# Patient Record
Sex: Female | Born: 1985 | Race: White | Hispanic: No | Marital: Single | Smoking: Former smoker
Health system: Southern US, Community
[De-identification: ages and names within clinical notes are randomized; demographics above are authoritative.]

## PROBLEM LIST (undated history)

## (undated) DIAGNOSIS — F419 Anxiety disorder, unspecified: Secondary | ICD-10-CM

## (undated) DIAGNOSIS — Q211 Atrial septal defect, unspecified: Secondary | ICD-10-CM

## (undated) DIAGNOSIS — I341 Nonrheumatic mitral (valve) prolapse: Secondary | ICD-10-CM

## (undated) DIAGNOSIS — I10 Essential (primary) hypertension: Secondary | ICD-10-CM

## (undated) HISTORY — DX: Nonrheumatic mitral (valve) prolapse: I34.1

## (undated) HISTORY — DX: Anxiety disorder, unspecified: F41.9

## (undated) HISTORY — DX: Essential (primary) hypertension: I10

## (undated) HISTORY — DX: Atrial septal defect, unspecified: Q21.10

## (undated) HISTORY — PX: CARDIAC SURGERY: SHX584

## (undated) HISTORY — DX: Atrial septal defect: Q21.1

---
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
## 2004-10-20 ENCOUNTER — Ambulatory Visit: Payer: Self-pay | Admitting: *Deleted

## 2007-02-04 ENCOUNTER — Ambulatory Visit: Payer: Self-pay | Admitting: Internal Medicine

## 2007-03-10 ENCOUNTER — Ambulatory Visit: Payer: Self-pay | Admitting: Internal Medicine

## 2007-05-20 ENCOUNTER — Ambulatory Visit: Payer: Self-pay | Admitting: Internal Medicine

## 2007-06-27 ENCOUNTER — Ambulatory Visit: Payer: Self-pay | Admitting: Internal Medicine

## 2007-10-15 ENCOUNTER — Emergency Department (HOSPITAL_COMMUNITY): Admission: EM | Admit: 2007-10-15 | Discharge: 2007-10-15 | Payer: Self-pay | Admitting: Emergency Medicine

## 2009-01-28 ENCOUNTER — Emergency Department (HOSPITAL_COMMUNITY): Admission: EM | Admit: 2009-01-28 | Discharge: 2009-01-28 | Payer: Self-pay | Admitting: Emergency Medicine

## 2009-02-18 ENCOUNTER — Ambulatory Visit (HOSPITAL_COMMUNITY): Admission: RE | Admit: 2009-02-18 | Discharge: 2009-02-18 | Payer: Self-pay | Admitting: Preventative Medicine

## 2009-06-25 ENCOUNTER — Telehealth (INDEPENDENT_AMBULATORY_CARE_PROVIDER_SITE_OTHER): Payer: Self-pay | Admitting: *Deleted

## 2009-07-03 ENCOUNTER — Ambulatory Visit: Payer: Self-pay | Admitting: Cardiology

## 2009-07-03 DIAGNOSIS — I059 Rheumatic mitral valve disease, unspecified: Secondary | ICD-10-CM | POA: Insufficient documentation

## 2009-07-03 DIAGNOSIS — Q211 Atrial septal defect: Secondary | ICD-10-CM | POA: Insufficient documentation

## 2009-07-03 DIAGNOSIS — I10 Essential (primary) hypertension: Secondary | ICD-10-CM | POA: Insufficient documentation

## 2009-09-14 HISTORY — PX: CHOLECYSTECTOMY: SHX55

## 2009-10-03 ENCOUNTER — Encounter: Payer: Self-pay | Admitting: Cardiology

## 2009-10-03 ENCOUNTER — Ambulatory Visit: Payer: Self-pay | Admitting: Cardiology

## 2009-10-03 ENCOUNTER — Ambulatory Visit (HOSPITAL_COMMUNITY): Admission: RE | Admit: 2009-10-03 | Discharge: 2009-10-03 | Payer: Self-pay | Admitting: Cardiology

## 2009-10-06 ENCOUNTER — Inpatient Hospital Stay (HOSPITAL_COMMUNITY): Admission: EM | Admit: 2009-10-06 | Discharge: 2009-10-09 | Payer: Self-pay | Admitting: Emergency Medicine

## 2009-10-07 ENCOUNTER — Ambulatory Visit: Payer: Self-pay | Admitting: Gastroenterology

## 2009-10-07 ENCOUNTER — Encounter: Payer: Self-pay | Admitting: Cardiology

## 2009-10-08 ENCOUNTER — Encounter (INDEPENDENT_AMBULATORY_CARE_PROVIDER_SITE_OTHER): Payer: Self-pay | Admitting: Internal Medicine

## 2009-10-17 ENCOUNTER — Encounter: Payer: Self-pay | Admitting: Gastroenterology

## 2009-11-06 ENCOUNTER — Ambulatory Visit: Payer: Self-pay | Admitting: Gastroenterology

## 2009-11-06 DIAGNOSIS — K5900 Constipation, unspecified: Secondary | ICD-10-CM | POA: Insufficient documentation

## 2009-11-06 DIAGNOSIS — K8020 Calculus of gallbladder without cholecystitis without obstruction: Secondary | ICD-10-CM | POA: Insufficient documentation

## 2010-07-10 ENCOUNTER — Ambulatory Visit: Payer: Self-pay | Admitting: Cardiology

## 2010-09-02 ENCOUNTER — Emergency Department (HOSPITAL_COMMUNITY)
Admission: EM | Admit: 2010-09-02 | Discharge: 2010-09-02 | Payer: Self-pay | Source: Home / Self Care | Admitting: Emergency Medicine

## 2010-09-09 ENCOUNTER — Encounter: Payer: Self-pay | Admitting: Orthopedic Surgery

## 2010-09-09 ENCOUNTER — Ambulatory Visit
Admission: RE | Admit: 2010-09-09 | Discharge: 2010-09-09 | Payer: Self-pay | Source: Home / Self Care | Attending: Cardiology | Admitting: Cardiology

## 2010-09-09 DIAGNOSIS — R079 Chest pain, unspecified: Secondary | ICD-10-CM | POA: Insufficient documentation

## 2010-09-10 ENCOUNTER — Encounter: Payer: Self-pay | Admitting: Cardiology

## 2010-09-16 ENCOUNTER — Ambulatory Visit
Admission: RE | Admit: 2010-09-16 | Discharge: 2010-09-16 | Payer: Self-pay | Source: Home / Self Care | Attending: Orthopedic Surgery | Admitting: Orthopedic Surgery

## 2010-09-16 ENCOUNTER — Encounter: Payer: Self-pay | Admitting: Orthopedic Surgery

## 2010-09-16 ENCOUNTER — Other Ambulatory Visit: Payer: Self-pay | Admitting: Cardiology

## 2010-09-16 ENCOUNTER — Ambulatory Visit (HOSPITAL_COMMUNITY)
Admission: RE | Admit: 2010-09-16 | Discharge: 2010-09-16 | Payer: Self-pay | Source: Home / Self Care | Attending: Cardiology | Admitting: Cardiology

## 2010-09-16 DIAGNOSIS — S62309A Unspecified fracture of unspecified metacarpal bone, initial encounter for closed fracture: Secondary | ICD-10-CM | POA: Insufficient documentation

## 2010-10-15 ENCOUNTER — Ambulatory Visit: Admit: 2010-10-15 | Payer: Self-pay | Admitting: Orthopedic Surgery

## 2010-10-15 ENCOUNTER — Ambulatory Visit: Admit: 2010-10-15 | Payer: Self-pay | Admitting: Cardiology

## 2010-10-15 ENCOUNTER — Ambulatory Visit (INDEPENDENT_AMBULATORY_CARE_PROVIDER_SITE_OTHER): Payer: Self-pay | Admitting: Orthopedic Surgery

## 2010-10-15 ENCOUNTER — Encounter: Payer: Self-pay | Admitting: Cardiology

## 2010-10-15 ENCOUNTER — Encounter: Payer: Self-pay | Admitting: Orthopedic Surgery

## 2010-10-15 ENCOUNTER — Ambulatory Visit (INDEPENDENT_AMBULATORY_CARE_PROVIDER_SITE_OTHER): Payer: Self-pay | Admitting: Cardiology

## 2010-10-15 DIAGNOSIS — F17200 Nicotine dependence, unspecified, uncomplicated: Secondary | ICD-10-CM | POA: Insufficient documentation

## 2010-10-15 DIAGNOSIS — I10 Essential (primary) hypertension: Secondary | ICD-10-CM

## 2010-10-15 DIAGNOSIS — S62309A Unspecified fracture of unspecified metacarpal bone, initial encounter for closed fracture: Secondary | ICD-10-CM

## 2010-10-15 DIAGNOSIS — Q211 Atrial septal defect: Secondary | ICD-10-CM

## 2010-10-15 DIAGNOSIS — R079 Chest pain, unspecified: Secondary | ICD-10-CM

## 2010-10-16 NOTE — Letter (Signed)
Summary:  Results Engineer, agricultural at [facility]  [address] [place] [address]   Phone: [phone]  Fax: [phone]      October 07, 2009 MRN: [id number]   [name] [name] [address] [place], [place]  [address]   Dear [name],  Your test ordered by Selena Batten has been reviewed by your physician (or physician assistant) and was found to be normal or stable. Your physician (or physician assistant) felt no changes were needed at this time.  __X__ Echocardiogram  ____ Cardiac Stress Test  ____ Lab Work  ____ Peripheral vascular study of arms, legs or neck  ____ CT scan or X-ray  ____ Lung or Breathing test  ____ Other: Please continue on current medical treatment.  Thank you.   Nona Dell, MD, F.A.C.C
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
## 2010-10-16 NOTE — Consult Note (Signed)
Summary: Consultation Report  Consultation Report   Imported By: Diana Eves 10/17/2009 10:50:13  _____________________________________________________________________  External Attachment:    Type:   Image     Comment:   External Document

## 2010-10-16 NOTE — Assessment & Plan Note (Signed)
Summary: 1 yr f/u per checkut on 07/03/09/tg      Allergies Added:   Visit Type:  Follow-up Primary Provider:  [facility] Department   History of Present Illness: 25 year old woman presents for annual followup. She denies any significant palpitations, and has had no exertional chest pain or shortness of breath. She is no longer on antihypertensive therapy.  Record review finds that she underwent a laparoscopic cholecystectomy back in January of this year. At that time she was taking Maxide, although this was stopped at discharge. She states that she has gained weight since earlier in the year. She is not exercising regularly.  She continues to smoke cigarettes. We have discussed smoking cessation.   We also talked about sodium restriction and weight loss, as her blood pressure is elevated today compared to the prior visit. She has not had recent followup for this with the health department.  I review the results of her echocardiogram done back in January. ECG is reviewed from January as well showing sinus bradycardia at 52 beats per minute with decreased anterior R wave progression. No acute changes noted. She did not want a repeat ECG today.    Current Medications (verified): 1)  Ortho Micronor 0.35 Mg Tabs (Norethindrone (Contraceptive)) .... Take 1 Tab Daily  Allergies (verified): 1)  ! Pcn 2)  ! Amoxicillin  Past History:  Past Medical History: Last updated: 07/03/2009 Congenital Heart Disease - ASD Hypertension Mitral Valve Prolapse  Past Surgical History: Last updated: 11/06/2009 ASD repair [facility] 2002. Cholecystectomy-stone, Dr. Lovell Sheehan 2011  Social History: Last updated: 03/13/2009 Single  Tobacco Use - Yes.  1 pack daily Regular Exercise - no Drug Use - no  Review of Systems       The patient complains of weight gain.  The patient denies anorexia, fever, chest pain, syncope, dyspnea on exertion, peripheral edema, prolonged cough,  abdominal pain, melena, and hematochezia.         Otherwise reviewed and negative.  Vital Signs:  Patient profile:   25 year old female Pulse rate:   76 / minute BP sitting:   138 / 94  (left arm)  Physical Exam  Additional Exam:  Overweight woman in no acute distress. HEENT: Conjunctivae and lids normal, oropharynx clear. Neck: Supple, no bruits or thyromegaly. Lungs: Clear to auscultation, nonlabored breathing at rest. Cardiac: Regular rate and rhythm, no loud mid-systolic click. Very soft systolic murmur at the base. No diastolic murmur. No S3 gallop. Extremities: No significant pitting edema. Skin: Warm and dry. Musculoskeletal: No kyphosis noted. Neuropsychiatric: Alert and oriented x3, affect appropriate.   Impression & Recommendations:  Problem # 1:  MITRAL VALVE PROLAPSE (ICD-424.0)  Not evident by followup echocardiogram in January 2011. Patient did have mild acentric mitral regurgitation which can be followed clinically at this point. LVEF 55-60%.  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  Problem # 3:  ESSENTIAL HYPERTENSION, BENIGN (ICD-401.1)  Blood pressure elevated today compared to last visit. She is no longer on Maxide. She has also gained weight. We discussed weight loss through combination of diet and exercise, also sodium restriction. She needs more regular followup of her blood pressure, particularly as she is on oral contraceptives. I recommended that she keep a log of her blood pressure measurements and follow up at the Health Department more regularly. She may need to resume medical therapy over time.  Patient Instructions: 1)  Your physician recommends that you schedule a follow-up appointment in: 1 year 2)  Your physician recommends that you continue on your current medications as directed. Please refer to the Current Medication list given to you  today.

## 2010-10-16 NOTE — Letter (Signed)
Summary: history form   history form   Imported By: Eugenio Hoes 09/17/2010 16:13:31  _____________________________________________________________________  External Attachment:    Type:   Image     Comment:   External Document
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
## 2010-10-16 NOTE — Assessment & Plan Note (Signed)
Summary: CONSTIPATION/GALLSTONES/CHOLECYSTECTOMY   Visit Type:  Follow-up Visit Primary Care Provider:  Health Dept  Chief Complaint:  hosp follow up.  History of Present Illness: Having rabbit terd stool. No pain. Appetite is good. taking stool softener. Has pain at incision sites as it rubs. No nausea, vomiting, heartburn and indigestion. Was having problems with constipation. BM:3-4x/week and now more frequent but still hard. Water: usu quite a bit. Fiber content?  Current Medications (verified): 1)  Ortho Micronor 0.35 Mg Tabs (Norethindrone (Contraceptive)) .... Take 1 Tab Daily  Allergies (verified): 1)  ! Pcn 2)  ! Amoxicillin  Past History:  Past Medical History: Last updated: 07/03/2009 Congenital Heart Disease - ASD Hypertension Mitral Valve Prolapse  Past Surgical History: ASD repair [facility] 2002. Cholecystectomy-stone, Dr. Lovell Sheehan 2011  Vital Signs:  Patient profile:   25 year old female Height:      64 inches Weight:      155 pounds BMI:     26.70 Temp:     97.7 degrees F oral Pulse rate:   76 / minute BP sitting:   120 / 88  (left arm) Cuff size:   regular  Vitals Entered By: Hendricks Limes LPN (November 06, 2009 10:38 AM)  Physical Exam  General:  Well developed, well nourished, no acute distress. Head:  Normocephalic and atraumatic. Lungs:  Clear throughout to auscultation. Heart:  Regular rate and rhythm; no murmurs Abdomen:  Soft, nontender and nondistended. Normal bowel sounds.  Impression & Recommendations:  Problem # 1:  CONSTIPATION (ICD-564.00) Assessment Unchanged  FOR HARD STOOLS-Drink 6-8 cups of water daily. Follow a high fiber diet. SEE HANDOUT. AVOID ITEMS THAT CAUSE BLOATING. May use stool softener 1-2 times a day. ADD BENEFIBER 2 tsp once daily and increase to two times a day after 7 days. Follow up with Dr. Darrick Penna as needed.  CC: PCP  Orders: Est. Patient Level III ([address])  Problem # 2:  CHOLELITHIASIS  (ICD-574.20) Assessment: Comment Only  s/p cholecystectomy  Orders: Est. Patient Level III ([address])  Patient Instructions: 1)  FOR HARD STOOLS 2)  Drink 6-8 cups of water daily. 3)  Follow a high fiber diet. SEE HANDOUT. 4)  **AVOID ITEMS THAT CAUSE BLOATING** 5)  May use stool softener 1-2 times a day. 6)  ADD BENEFIBER 2 tsp once daily and increase to two times a day after 7 days. 7)  Follow up with Dr. Darrick Penna as needed. 8)  The medication list was reviewed and reconciled.  All changed / newly prescribed medications were explained.  A complete medication list was provided to the patient / caregiver.
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
## 2010-10-22 NOTE — Letter (Signed)
Summary: Work Writer at [employer]  [address] [place] [address]   Phone: [phone]  Fax: [phone]     October 15, 2010    [name] [name]   The above named patient had a medical visit today at: 3:20 pm.  Please take this into consideration when reviewing the time away from work/school.      Sincerely yours,    Architectural technologist

## 2010-10-22 NOTE — Letter (Signed)
Summary: Out of Work  [facility]  [address]  [place], [place] [address]   Phone: [phone]  Fax: [phone]    October 15, 2010   Employee:  [name] [name]    To Whom It May Concern:   For Medical reasons, please excuse the above named employee from work for the following dates:  Start:   October 15, 2010 - appointment in our office today  End/Return to work:      October 16, 2010     If you need additional information, please feel free to contact our office.         Sincerely,    Terrance Mass, MD
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
## 2010-10-28 ENCOUNTER — Encounter: Payer: Self-pay | Admitting: Orthopedic Surgery

## 2010-11-05 NOTE — Letter (Signed)
Summary: Medical records request [facility]  Medical records request [facility]   Imported By: Cammie Sickle 10/28/2010 19:11:12  _____________________________________________________________________  External Attachment:    Type:   Image     Comment:   External Document

## 2010-11-24 LAB — POCT PREGNANCY, URINE: Preg Test, Ur: NEGATIVE

## 2010-11-30 LAB — DIFFERENTIAL
Basophils Absolute: 0 10*3/uL (ref 0.0–0.1)
Basophils Absolute: 0 10*3/uL (ref 0.0–0.1)
Basophils Absolute: 0.1 10*3/uL (ref 0.0–0.1)
Eosinophils Relative: 5 % (ref 0–5)
Lymphocytes Relative: 24 % (ref 12–46)
Lymphocytes Relative: 26 % (ref 12–46)
Lymphocytes Relative: 35 % (ref 12–46)
Lymphs Abs: 1.9 10*3/uL (ref 0.7–4.0)
Lymphs Abs: 2.1 10*3/uL (ref 0.7–4.0)
Monocytes Absolute: 0.6 10*3/uL (ref 0.1–1.0)
Monocytes Absolute: 0.6 10*3/uL (ref 0.1–1.0)
Monocytes Absolute: 0.7 10*3/uL (ref 0.1–1.0)
Monocytes Relative: 8 % (ref 3–12)
Neutro Abs: 2.8 10*3/uL (ref 1.7–7.7)
Neutro Abs: 4.7 10*3/uL (ref 1.7–7.7)
Neutro Abs: 5.2 10*3/uL (ref 1.7–7.7)
Neutrophils Relative %: 64 % (ref 43–77)

## 2010-11-30 LAB — URINE MICROSCOPIC-ADD ON

## 2010-11-30 LAB — HEPATIC FUNCTION PANEL
ALT: 203 U/L — ABNORMAL HIGH (ref 0–35)
ALT: 377 U/L — ABNORMAL HIGH (ref 0–35)
AST: 117 U/L — ABNORMAL HIGH (ref 0–37)
AST: 290 U/L — ABNORMAL HIGH (ref 0–37)
Albumin: 3 g/dL — ABNORMAL LOW (ref 3.5–5.2)
Alkaline Phosphatase: 135 U/L — ABNORMAL HIGH (ref 39–117)
Bilirubin, Direct: 2.6 mg/dL — ABNORMAL HIGH (ref 0.0–0.3)
Total Protein: 5.6 g/dL — ABNORMAL LOW (ref 6.0–8.3)

## 2010-11-30 LAB — COMPREHENSIVE METABOLIC PANEL
ALT: 305 U/L — ABNORMAL HIGH (ref 0–35)
AST: 122 U/L — ABNORMAL HIGH (ref 0–37)
AST: 190 U/L — ABNORMAL HIGH (ref 0–37)
Albumin: 3.1 g/dL — ABNORMAL LOW (ref 3.5–5.2)
Albumin: 3.5 g/dL (ref 3.5–5.2)
Alkaline Phosphatase: 111 U/L (ref 39–117)
BUN: 4 mg/dL — ABNORMAL LOW (ref 6–23)
CO2: 26 mEq/L (ref 19–32)
Calcium: 8.7 mg/dL (ref 8.4–10.5)
Chloride: 105 mEq/L (ref 96–112)
Creatinine, Ser: 0.77 mg/dL (ref 0.4–1.2)
Creatinine, Ser: 0.79 mg/dL (ref 0.4–1.2)
GFR calc Af Amer: >60 mL/min (ref >60)
GFR calc Af Amer: >60 mL/min (ref >60)
GFR calc non Af Amer: >60 mL/min (ref >60)
Potassium: 4 mEq/L (ref 3.5–5.1)
Total Bilirubin: 2.4 mg/dL — ABNORMAL HIGH (ref 0.3–1.2)
Total Bilirubin: 2.9 mg/dL — ABNORMAL HIGH (ref 0.3–1.2)
Total Protein: 5.7 g/dL — ABNORMAL LOW (ref 6.0–8.3)

## 2010-11-30 LAB — BASIC METABOLIC PANEL
BUN: 6 mg/dL (ref 6–23)
CO2: 27 mEq/L (ref 19–32)
Calcium: 9 mg/dL (ref 8.4–10.5)
Chloride: 102 mEq/L (ref 96–112)
Creatinine, Ser: 0.85 mg/dL (ref 0.4–1.2)
GFR calc Af Amer: >60 mL/min (ref >60)
GFR calc non Af Amer: >60 mL/min (ref >60)
Potassium: 2.7 mEq/L — CL (ref 3.5–5.1)
Potassium: 3.4 mEq/L — ABNORMAL LOW (ref 3.5–5.1)
Sodium: 136 mEq/L (ref 135–145)

## 2010-11-30 LAB — URINALYSIS, ROUTINE W REFLEX MICROSCOPIC
Glucose, UA: NEGATIVE mg/dL
Ketones, ur: NEGATIVE mg/dL
Protein, ur: NEGATIVE mg/dL
pH: 6.5 (ref 5.0–8.0)

## 2010-11-30 LAB — CBC
HCT: 37.4 % (ref 36.0–46.0)
HCT: 39.2 % (ref 36.0–46.0)
Hemoglobin: 14.8 g/dL (ref 12.0–15.0)
MCHC: 33.8 g/dL (ref 30.0–36.0)
MCV: 88.8 fL (ref 78.0–100.0)
Platelets: 157 10*3/uL (ref 150–400)
Platelets: 169 10*3/uL (ref 150–400)
RBC: 4.26 MIL/uL (ref 3.87–5.11)
RBC: 4.93 MIL/uL (ref 3.87–5.11)
RDW: 12.6 % (ref 11.5–15.5)
RDW: 13 % (ref 11.5–15.5)
WBC: 5.5 10*3/uL (ref 4.0–10.5)
WBC: 7.8 10*3/uL (ref 4.0–10.5)
WBC: 8.1 10*3/uL (ref 4.0–10.5)

## 2010-11-30 LAB — PROTIME-INR: INR: 1.04 (ref 0.00–1.49)

## 2010-11-30 LAB — TSH: TSH: 2.968 u[IU]/mL (ref 0.350–4.500)

## 2010-11-30 LAB — HEPATITIS PANEL, ACUTE
Hep B C IgM: NEGATIVE
Hepatitis B Surface Ag: NEGATIVE

## 2010-11-30 LAB — BILIRUBIN, DIRECT: Bilirubin, Direct: 1.6 mg/dL — ABNORMAL HIGH (ref 0.0–0.3)

## 2011-01-27 NOTE — Assessment & Plan Note (Signed)
[facility]                       [place]ARDIOLOG[facility] OFFICE NOTE   NAME:[name], [name] KRONBERG                     MRN:          [id number]  DATE:02/04/2007                            DOB:          [birth year]    IDENTIFYING DATA:  The patient is a 25 year old woman who was previously  followed by Lorna Few, M.D.   HISTORY OF THE PRESENT ILLNESS:  The patient has a history of ASD that  is status post repair.  She also has a history of mitral valve prolapse.  Her last echocardiogram was in 2004 that showed mild mitral  regurgitation with normal right ventricular function and left  ventricular function.   Since being seen she denies shortness of breath and no chest pain.  She  has problems with allergies.  She takes Zyrtec for this.   MEDICATIONS:  Current medications include:  1. Zyrtec as needed.  2. ___________ daily.   ALLERGIES:  PENICILLIN.   SOCIAL HISTORY:  The patient continues to smoke a pack every two days.  She does not drink. She is out of work and is looking for a job.  She  has no insurance.   FAMILY HISTORY:  The family history is negative for congenital heart  disease.   REVIEW OF SYSTEMS:  All systems are reviewed. Note, she has occasional  palpitations, otherwise negative to the above problem,  except as noted.   PHYSICAL EXAMINATION:  GENERAL APPEARANCE: On exam the patient is in no  distress.  VITAL SIGNS:  The blood pressure is 134/100.  Pulse is 86.  Weight 164.  LUNGS:  The lungs are clear with cough.  NECK:  JVP is normal with no bruits.  HEART:  Cardiac examination shows regular rate and rhythm.  S1 and S2,  no S3 and no murmurs.  ABDOMEN:  He abdomen is benign.  EXTREMITIES:  The extremities have no edema.   DISCUSSION:  The patient appears to be doing well from after her repair.  I do not hear any murmur to suggest mitral regurgitation.  Blood  pressure was high; I did not notice this when she was here; the  diastolic was 100.  I will need to get in touch with Dr. Karmen Stabs office  regarding blood pressures; they may have her come in just to have it  rechecked.   I will continue her on the current regimen.  I have counseled her about  tobacco,  I would like to see her back in 18 months, or sooner if  problems develop.     Pricilla Riffle, MD, [facility]  Electronically Signed    [name]  DD: 02/04/2007  DT: 02/04/2007  Job #: [id number]   cc:   Colon Flattery, D.O.

## 2011-01-27 NOTE — Assessment & Plan Note (Signed)
[name]                       [facility]ARDIOLOGY OFFICE NOTE   NAME:[name], [name] LAUTURE                     MRN:          [id number]  DATE:05/20/2007                            DOB:          08-12-1986    PATIENT IDENTIFICATION:  [name] is a 25 year old woman who I followed  in clinic.  She has a history of ASD status post repair, history of  mitral valve prolapse, also, history of hypertension.   When I saw her last, I gave her a blood pressure cuff to take home to  take her blood pressure.  She brings back the diary.  Note, she has  switched to oral contraceptives, different brands during this time.  Blood pressures have ranged from 110s to 160s/70s to low 100s.  The  patient has been asymptomatic.  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  She is still smoking about 8 cigarettes per day.   CURRENT MEDICATIONS:  Ortho brand contraceptives.   PHYSICAL EXAMINATION:  GENERAL:  On exam, the patient is in no distress.  VITAL SIGNS:  Blood pressure is 124/90, pulse 68 and regular, weight  155.  NECK:  JVP is normal.  LUNGS:  Clear.  No rales.  CARDIAC:  Regular rate and rhythm.  S1, S2.  No S3.  No murmurs.  ABDOMEN:  Benign.  EXTREMITIES:  No edema.   IMPRESSION:  1. Hypertension.  I would recommend checking a CBC, B-met, TSH today.      Also, urinalysis.  Will go ahead and set her up on the Maxzide,      37.5/25 today.  Take one half tablet, and I will see her back in      four weeks time.  2. Health care maintenance.  Again, counseled on tobacco.  If she      comes off the oral contraceptives, of course, may have to      reevaluate her blood pressure, but for now, she is planning on      maintaining them.  Again, she was counseled of the dangers of      smoking on oral contraceptives.     Pricilla Riffle, MD, [facility]  Electronically Signed    [name]  DD: 05/20/2007  DT: 05/20/2007  Job #: [id number]

## 2011-01-27 NOTE — Assessment & Plan Note (Signed)
[facility]                        CARDIOLOGY OFFICE NOTE   NAME:[name], [name] BOULANGER                     MRN:          [id number]  DATE:06/27/2007                            DOB:          05-07-1986    IDENTIFICATION:  [name] [name] is a 25 year old woman with a history of  hypertension, ASD (status post repair), and MVP.  She also has a history  of tobacco use, and continues to smoke.   I saw her in September and put her on Maxzide 37.5/25.  She comes in  today for return.   Note, she brings a blood pressure log.  Her blood pressures at home with  her cuff were actually in the 118 to 134 over 82 to 98 range.  She takes  it all times during the day.  She usually rests 5 minutes before taking  it.   She is still smoking.  She is proud, though, that her weight is down  from 164 to 149.  Her whole family smokes.  She said it would be very  tough to quit, she has tried in the past, and could not.   CURRENT MEDICATIONS:  Include:  1. Maxzide 37.5/25.  2. Ortho Novum, question brand.   PHYSICAL EXAMINATION:  Patient is in no distress.  Blood pressure is 110/76.  Pulse is 88.  Weight 149, down from 155 in  September.  LUNGS:  Clear.  CARDIAC EXAM:  Regular rate and rhythm.  S1 and S2.  No S3.  ABDOMEN:  Benign.  EXTREMITIES:  No edema.   IMPRESSION:  1. Hypertension, better.  We actually had her cuff here, and it read      to the numbers we got.  I am not sure is she is more stressed when      she takes it at home, but I would continue for right now.  2. Healthcare maintenance.  Again, counseled her on smoking.      Especially, I would not let her smoke and be on contraceptives.      This was not prescribed by me, and I have counseled on this as      well.   I would like to get a BMET.  We will see if we can get the free clinic  to pay for it, as she is on minimal funds.   Otherwise, I will set to see her in 6 months, sooner if problems  develop.  I will be in touch with her with the blood results.    Pricilla Riffle, MD, [facility]  Electronically Signed   [name]  DD: 06/27/2007  DT: 06/28/2007  Job #: [id number]

## 2011-01-27 NOTE — Assessment & Plan Note (Signed)
[facility]                       [facility] CARDIOLOGY OFFICE NOTE   [name], [name] [name]                     MRN:          [id number]  DATE:03/10/2007                            DOB:          1986/02/14    PATIENT IDENTIFICATION:  [name] is a 25 year old woman with a history  of ASD status post repair and also a history of mitral valve prolapse.  She had an eccentric MR jet on echo back in 2004.   I saw the patient back for followup, note she has no insurance, this was  back in may. When I saw her, her blood pressure was high and I had her  come back.   Note she has taken blood pressures at [facility] since and another drug  store. They have ranged anywhere from the 130s to 150s systolic over 90s  to 100s. She said recently she is having a stress out of her life with a  girl she knows who is no longer associating with her. She said this has  helped considerably with stress load.   CURRENT MEDICATIONS:  Mircette daily.   REVIEW OF SYSTEMS:  Continues to smoke daily.   PHYSICAL EXAMINATION:  GENERAL:  The patient is in no distress.  VITAL SIGNS:  Blood pressure on arrival 127/84, on my check right arm  135/90, left arm 125/85, pulse is 70 and regular, weight 158.  LUNGS:  Clear.  CARDIAC:  Regular rate and rhythm, S1, S2, no S3. No significant  murmurs.  EXTREMITIES:  No edema.   IMPRESSION:  Hypertension amazingly better. I have given her a cuff to  take home and she will follow her blood pressures over the next months.   Again, concerning given that she is on oral contraceptives, 2) tobacco,  very concerning that she was prescribed oral contraceptives. I will be  in touch with the doctors at [facility], the [facility] health facility that  she goes to discuss further. Again, I counseled her on the risk of  thrombosis with this.   I will see the patient back in 1 month's time, sooner if problems  develop.     Pricilla Riffle, MD, [facility]  Electronically Signed    [name]  DD: 03/10/2007  DT: 03/11/2007  Job #: [id number]

## 2011-04-20 ENCOUNTER — Ambulatory Visit (HOSPITAL_COMMUNITY)
Admission: RE | Admit: 2011-04-20 | Discharge: 2011-04-20 | Disposition: A | Discharge status: Home / Self Care | Payer: BC Managed Care – PPO | Source: Ambulatory Visit | Attending: Family Medicine | Admitting: Family Medicine

## 2011-04-20 ENCOUNTER — Other Ambulatory Visit (HOSPITAL_COMMUNITY): Payer: Self-pay | Admitting: Family Medicine

## 2011-04-20 DIAGNOSIS — R0789 Other chest pain: Secondary | ICD-10-CM | POA: Insufficient documentation

## 2011-04-20 DIAGNOSIS — R52 Pain, unspecified: Secondary | ICD-10-CM

## 2011-11-06 ENCOUNTER — Encounter: Payer: Self-pay | Admitting: Adult Health

## 2011-11-10 ENCOUNTER — Ambulatory Visit (INDEPENDENT_AMBULATORY_CARE_PROVIDER_SITE_OTHER): Payer: BC Managed Care – PPO | Admitting: Adult Health

## 2011-11-10 ENCOUNTER — Encounter: Payer: Self-pay | Admitting: Adult Health

## 2011-11-10 VITALS — BP 135/93 | HR 69 | Resp 18 | Ht 64.0 in | Wt 180.0 lb

## 2011-11-10 DIAGNOSIS — I10 Essential (primary) hypertension: Secondary | ICD-10-CM

## 2011-11-10 DIAGNOSIS — Q211 Atrial septal defect: Secondary | ICD-10-CM

## 2011-11-10 DIAGNOSIS — F17200 Nicotine dependence, unspecified, uncomplicated: Secondary | ICD-10-CM

## 2011-11-10 NOTE — Assessment & Plan Note (Signed)
BP is normal today. She will not be placed on antihypertensives at this time. She is to follow-up with PCP.

## 2011-11-10 NOTE — Assessment & Plan Note (Signed)
Cessation is discussed. She verbalizes understanding.

## 2011-11-10 NOTE — Assessment & Plan Note (Signed)
S/P repair at [facility] in 2002. She is asymptomatic with follow-up echo in 2012 normal. Annual checks to be continued.

## 2011-11-10 NOTE — Progress Notes (Signed)
   HPI: [name] is a 26 y/o patient of Dr. Diona Browner we are following for ongoing assessment of ASD repair completed in 2002, hypertension, with history of ongoing tobacco abuse. She comes today without complaint. She is active and has had no ER visits or hospitalizations since being seen last.  Allergies  Allergen Reactions  . Amoxicillin   . Penicillins     Current Outpatient Prescriptions  Medication Sig Dispense Refill  . ALPRAZolam (XANAX) 0.5 MG tablet Take 0.5 mg by mouth at bedtime as needed.      . cyclobenzaprine (FLEXERIL) 10 MG tablet Take 10 mg by mouth as needed.        Marland Kitchen escitalopram (LEXAPRO) 10 MG tablet Take 10 mg by mouth daily.      Marland Kitchen ibuprofen (ADVIL,MOTRIN) 800 MG tablet Take 800 mg by mouth 3 (three) times daily.        . norethindrone (MICRONOR) 0.35 MG tablet Take 1 tablet by mouth daily.          Past Medical History  Diagnosis Date  . ASD (atrial septal defect)   . Hypertension   . Mitral valve prolapse     Past Surgical History  Procedure Date  . Asd repair 2002    Open heart - [facility]   . Cholecystectomy 2011     [facility] -Dr. Lovell Sheehan     RUE:AVWUJW of systems complete and found to be negative unless listed above  PHYSICAL EXAM BP 135/93  Pulse 69  Resp 18  Ht 5\' 4"  (1.626 m)  Wt 180 lb (81.647 kg)  BMI 30.90 kg/m2  General: Well developed, well nourished, in no acute distress Head: Eyes PERRLA, No xanthomas.   Normal cephalic and atramatic  Lungs: Clear bilaterally to auscultation and percussion. Heart: HRRR S1 S2, without MRG.  Pulses are 2+ & equal.            No carotid bruit. No JVD.  No abdominal bruits. No femoral bruits. Abdomen: Bowel sounds are positive, abdomen soft and non-tender without masses or                  Hernia's noted. Msk:  Back normal, normal gait. Normal strength and tone for age. Extremities: No clubbing, cyanosis or edema.  DP +1 Neuro: Alert and oriented X 3. Psych:  Good affect, responds  appropriately  EKG: NSR rightward axis rate of 69 bpm  ASSESSMENT AND PLAN
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
## 2013-08-14 ENCOUNTER — Ambulatory Visit (INDEPENDENT_AMBULATORY_CARE_PROVIDER_SITE_OTHER): Payer: BC Managed Care – PPO | Admitting: Cardiology

## 2013-08-14 ENCOUNTER — Encounter: Payer: Self-pay | Admitting: Cardiology

## 2013-08-14 VITALS — BP 130/90 | HR 75 | Ht 64.0 in | Wt 183.2 lb

## 2013-08-14 DIAGNOSIS — F17200 Nicotine dependence, unspecified, uncomplicated: Secondary | ICD-10-CM

## 2013-08-14 DIAGNOSIS — I10 Essential (primary) hypertension: Secondary | ICD-10-CM

## 2013-08-14 DIAGNOSIS — Q211 Atrial septal defect: Secondary | ICD-10-CM

## 2013-08-14 NOTE — Progress Notes (Signed)
    Clinical Summary [name] is a 27 y.o.female last seen by Ms. Lawrence NP in February 2013. She reports no interval cardiac complaints, no hospitalizations. She follows with Dr. Phillips [name] at this point. Medications are noted below.  Echocardiogram in January 2012 showed normal LV wall thickness and chamber size with LVEF 55-60%, no residual ASD, no major valvular abnormalities.  ECG shows sinus rhythm, somewhat rightward axis. No acute ST segment changes.   Allergies  Allergen Reactions  . Amoxicillin   . Other     Ivory soap breaks her out in a rash  . Penicillins     Current Outpatient Prescriptions  Medication Sig Dispense Refill  . ALPRAZolam (XANAX) 0.5 MG tablet Take 0.5 mg by mouth at bedtime as needed.      . cyclobenzaprine (FLEXERIL) 10 MG tablet Take 10 mg by mouth as needed.        Marland Kitchen ibuprofen (ADVIL,MOTRIN) 800 MG tablet Take 800 mg by mouth 3 (three) times daily.        . norethindrone (MICRONOR) 0.35 MG tablet Take 1 tablet by mouth daily.         No current facility-administered medications for this visit.    Past Medical History  Diagnosis Date  . ASD (atrial septal defect)   . Essential hypertension, benign   . Mitral valve prolapse     Past Surgical History  Procedure Laterality Date  . Asd repair  [address] [place]   . Cholecystectomy  2011    [facility] - Dr. Lovell [name]     Social History [name] reports that she has been smoking Cigarettes.  She has a 3.75 pack-year smoking history. She does not have any smokeless tobacco history on file. [name] reports that she drinks alcohol.  Review of Systems No dizziness or syncope. States she was able to quit smoking, but has relapsed. No focal motor weakness or speech deficits. Otherwise negative.  Physical Examination Filed Vitals:   08/14/13 1542  BP: 130/90  Pulse: 75   Filed Weights   08/14/13 1510  Weight: 183 lb 4 oz (83.122 kg)   Patient appears comfortable at rest. HEENT:  Conjunctiva and lids normal, oropharynx clear. Neck: Supple, no elevated JVP or carotid bruits, no thyromegaly. Lungs: Clear to auscultation, nonlabored breathing at rest. Cardiac: Regular rate and rhythm, no S3 or significant systolic murmur, no pericardial rub. Abdomen: Soft, nontender,bowel sounds present, no guarding or rebound. Extremities: No pitting edema, distal pulses 2+. Skin: Warm and dry. Musculoskeletal: No kyphosis. Neuropsychiatric: Alert and oriented x3, affect grossly appropriate.   Problem List and Plan   ATRIAL SEPTAL DEFECT Status post repair at [facility] in 2002. Followup echocardiogram in 2012 showed no residual defect. Symptomatically stable.  TOBACCO ABUSE She is trying to work back toward smoking cessation again.  Essential hypertension, benign Recommended keep follow with Dr. Phillips [name] for evaluation of blood pressure over time.    Jonelle Sidle, M.D., F.A.C.C.

## 2013-08-14 NOTE — Assessment & Plan Note (Signed)
She is trying to work back toward smoking cessation again.

## 2013-08-14 NOTE — Patient Instructions (Addendum)
Your physician recommends that you schedule a follow-up appointment in one year   Your physician recommends that you continue on your current medications as directed. Please refer to the Current Medication list given to you today.  

## 2013-08-14 NOTE — Assessment & Plan Note (Signed)
Status post repair at [facility] in 2002. Followup echocardiogram in 2012 showed no residual defect. Symptomatically stable.

## 2013-08-14 NOTE — Assessment & Plan Note (Signed)
Recommended keep follow with Dr. Phillips Odor for evaluation of blood pressure over time.

## 2013-09-13 ENCOUNTER — Ambulatory Visit: Payer: BC Managed Care – PPO | Admitting: Cardiology

## 2017-07-27 DIAGNOSIS — Z6837 Body mass index (BMI) 37.0-37.9, adult: Secondary | ICD-10-CM | POA: Diagnosis not present

## 2017-07-27 DIAGNOSIS — J301 Allergic rhinitis due to pollen: Secondary | ICD-10-CM | POA: Diagnosis not present

## 2017-07-27 DIAGNOSIS — J209 Acute bronchitis, unspecified: Secondary | ICD-10-CM | POA: Diagnosis not present

## 2017-09-16 DIAGNOSIS — R635 Abnormal weight gain: Secondary | ICD-10-CM | POA: Diagnosis not present

## 2017-09-16 DIAGNOSIS — Z1389 Encounter for screening for other disorder: Secondary | ICD-10-CM | POA: Diagnosis not present

## 2017-09-16 DIAGNOSIS — Z01411 Encounter for gynecological examination (general) (routine) with abnormal findings: Secondary | ICD-10-CM | POA: Diagnosis not present

## 2017-09-16 DIAGNOSIS — R5383 Other fatigue: Secondary | ICD-10-CM | POA: Diagnosis not present

## 2017-09-16 DIAGNOSIS — Z6836 Body mass index (BMI) 36.0-36.9, adult: Secondary | ICD-10-CM | POA: Diagnosis not present

## 2017-11-02 ENCOUNTER — Encounter: Payer: Self-pay | Admitting: Women's Health

## 2017-11-02 ENCOUNTER — Ambulatory Visit: Payer: BLUE CROSS/BLUE SHIELD | Admitting: Women's Health

## 2017-11-02 VITALS — BP 158/100 | HR 72 | Ht 64.0 in | Wt 211.0 lb

## 2017-11-02 DIAGNOSIS — Z3009 Encounter for other general counseling and advice on contraception: Secondary | ICD-10-CM | POA: Diagnosis not present

## 2017-11-02 DIAGNOSIS — R109 Unspecified abdominal pain: Secondary | ICD-10-CM | POA: Diagnosis not present

## 2017-11-02 DIAGNOSIS — I10 Essential (primary) hypertension: Secondary | ICD-10-CM

## 2017-11-02 MED ORDER — MISOPROSTOL 200 MCG PO TABS: 600.0000 ug | ORAL_TABLET | Freq: Once | ORAL | 0 refills | Status: DC

## 2017-11-02 NOTE — Patient Instructions (Signed)
Take the misoprostol (cytotec) 2-3 hours before your appointment  Levonorgestrel intrauterine device (IUD) What is this medicine? LEVONORGESTREL IUD (LEE voe nor jes trel) is a contraceptive (birth control) device. The device is placed inside the uterus by a healthcare professional. It is used to prevent pregnancy. This device can also be used to treat heavy bleeding that occurs during your period. This medicine may be used for other purposes; ask your health care provider or pharmacist if you have questions. COMMON BRAND NAME(S): Cameron Ali What should I tell my health care provider before I take this medicine? They need to know if you have any of these conditions: -abnormal Pap smear -cancer of the breast, uterus, or cervix -diabetes -endometritis -genital or pelvic infection now or in the past -have more than one sexual partner or your partner has more than one partner -heart disease -history of an ectopic or tubal pregnancy -immune system problems -IUD in place -liver disease or tumor -problems with blood clots or take blood-thinners -seizures -use intravenous drugs -uterus of unusual shape -vaginal bleeding that has not been explained -an unusual or allergic reaction to levonorgestrel, other hormones, silicone, or polyethylene, medicines, foods, dyes, or preservatives -pregnant or trying to get pregnant -breast-feeding How should I use this medicine? This device is placed inside the uterus by a health care professional. Talk to your pediatrician regarding the use of this medicine in children. Special care may be needed. Overdosage: If you think you have taken too much of this medicine contact a poison control center or emergency room at once. NOTE: This medicine is only for you. Do not share this medicine with others. What if I miss a dose? This does not apply. Depending on the brand of device you have inserted, the device will need to be replaced every 3  to 5 years if you wish to continue using this type of birth control. What may interact with this medicine? Do not take this medicine with any of the following medications: -amprenavir -bosentan -fosamprenavir This medicine may also interact with the following medications: -aprepitant -armodafinil -barbiturate medicines for inducing sleep or treating seizures -bexarotene -boceprevir -griseofulvin -medicines to treat seizures like carbamazepine, ethotoin, felbamate, oxcarbazepine, phenytoin, topiramate -modafinil -pioglitazone -rifabutin -rifampin -rifapentine -some medicines to treat HIV infection like atazanavir, efavirenz, indinavir, lopinavir, nelfinavir, tipranavir, ritonavir -St. John's wort -warfarin This list may not describe all possible interactions. Give your health care provider a list of all the medicines, herbs, non-prescription drugs, or dietary supplements you use. Also tell them if you smoke, drink alcohol, or use illegal drugs. Some items may interact with your medicine. What should I watch for while using this medicine? Visit your doctor or health care professional for regular check ups. See your doctor if you or your partner has sexual contact with others, becomes HIV positive, or gets a sexual transmitted disease. This product does not protect you against HIV infection (AIDS) or other sexually transmitted diseases. You can check the placement of the IUD yourself by reaching up to the top of your vagina with clean fingers to feel the threads. Do not pull on the threads. It is a good habit to check placement after each menstrual period. Call your doctor right away if you feel more of the IUD than just the threads or if you cannot feel the threads at all. The IUD may come out by itself. You may become pregnant if the device comes out. If you notice that the IUD has come out use  a backup birth control method like condoms and call your health care provider. Using tampons  will not change the position of the IUD and are okay to use during your period. This IUD can be safely scanned with magnetic resonance imaging (MRI) only under specific conditions. Before you have an MRI, tell your healthcare provider that you have an IUD in place, and which type of IUD you have in place. What side effects may I notice from receiving this medicine? Side effects that you should report to your doctor or health care professional as soon as possible: -allergic reactions like skin rash, itching or hives, swelling of the face, lips, or tongue -fever, flu-like symptoms -genital sores -high blood pressure -no menstrual period for 6 weeks during use -pain, swelling, warmth in the leg -pelvic pain or tenderness -severe or sudden headache -signs of pregnancy -stomach cramping -sudden shortness of breath -trouble with balance, talking, or walking -unusual vaginal bleeding, discharge -yellowing of the eyes or skin Side effects that usually do not require medical attention (report to your doctor or health care professional if they continue or are bothersome): -acne -breast pain -change in sex drive or performance -changes in weight -cramping, dizziness, or faintness while the device is being inserted -headache -irregular menstrual bleeding within first 3 to 6 months of use -nausea This list may not describe all possible side effects. Call your doctor for medical advice about side effects. You may report side effects to FDA at 1-800-FDA-1088. Where should I keep my medicine? This does not apply. NOTE: This sheet is a summary. It may not cover all possible information. If you have questions about this medicine, talk to your doctor, pharmacist, or health care provider.  2018 Elsevier/Gold Standard (2016-06-12 14:14:56)

## 2017-11-02 NOTE — Progress Notes (Signed)
   GYN VISIT Patient name: [name] [name] MRN [id number]  Date of birth: 03/12/1986 Chief Complaint:   discuss getting IUD  History of Present Illness:   [name] [name] is a 32 y.o. G0P0000 Caucasian female being seen today wanting to discuss getting IUD. Has regular periods, last 7d, changes pad about 3x/day, some clots, bad cramping. Is not planning pregnancy anytime soon. Her period is just ending.     Has CHTN, didn't take meds this am.   Patient's last menstrual period was 10/27/2017. The current method of family planning is condoms  [facility] [place] [facility]. Results were:  normal Review of Systems:   Pertinent items are noted in HPI Denies fever/chills, dizziness, headaches, visual disturbances, fatigue, shortness of breath, chest pain, abdominal pain, vomiting, abnormal vaginal discharge/itching/odor/irritation, problems with periods, bowel movements, urination, or intercourse unless otherwise stated above.  Pertinent History Reviewed:  Reviewed past medical,surgical, social, obstetrical and family history.  Reviewed problem list, medications and allergies. Physical Assessment:   Vitals:   11/02/17 1518  BP: (!) 158/100  Pulse: 72  Weight: 211 lb (95.7 kg)  Height: 5\' 4"  (1.626 m)  Body mass index is 36.22 kg/m.       Physical Examination:   General appearance: alert, well appearing, and in no distress  Mental status: alert, oriented to person, place, and time  Skin: warm & dry   Cardiovascular: normal heart rate noted, HRRR  Respiratory: normal respiratory effort, no distress, LCTAB  Abdomen: soft, non-tender   Pelvic: examination not indicated  Extremities: no edema   No results found for this or any previous visit (from the past 24 hour(s)).  Assessment & Plan:  1) Contraception counseling> wants Mirena IUD, discussed in detail and gave printed info. Next period should be on around 3/15, to take cytotec 2-3hrs before appt. Call if not on period on 3/15 to  reschedule for when on period.   Meds:  Meds ordered this encounter  Medications  . misoprostol (CYTOTEC) 200 MCG tablet    Sig: Take 3 tablets (600 mcg total) by mouth once for 1 dose. 2-3 hours befor your appointment for IUD insertion    Dispense:  3 tablet    Refill:  0    Order Specific Question:   Supervising Provider    Answer:   Despina HiddenEURE, LUTHER H [[id number]]    No orders of the defined types were placed in this encounter.   Return in 3 weeks (on 11/26/2017) for IUD insertion.  Cheral MarkerKimberly R Kamani Magnussen CNM, [facility] 11/02/2017 4:01 PM

## 2017-11-11 ENCOUNTER — Ambulatory Visit (INDEPENDENT_AMBULATORY_CARE_PROVIDER_SITE_OTHER): Payer: BLUE CROSS/BLUE SHIELD

## 2017-11-11 ENCOUNTER — Ambulatory Visit (HOSPITAL_COMMUNITY)
Admission: EM | Admit: 2017-11-11 | Discharge: 2017-11-11 | Disposition: A | Discharge status: Home / Self Care | Payer: BLUE CROSS/BLUE SHIELD | Attending: Internal Medicine | Admitting: Internal Medicine

## 2017-11-11 ENCOUNTER — Other Ambulatory Visit: Payer: Self-pay

## 2017-11-11 ENCOUNTER — Encounter (HOSPITAL_COMMUNITY): Payer: Self-pay | Admitting: Emergency Medicine

## 2017-11-11 DIAGNOSIS — S93401A Sprain of unspecified ligament of right ankle, initial encounter: Secondary | ICD-10-CM

## 2017-11-11 DIAGNOSIS — M25571 Pain in right ankle and joints of right foot: Secondary | ICD-10-CM | POA: Diagnosis not present

## 2017-11-11 NOTE — ED Triage Notes (Signed)
Pt states last night she twisted her R ankle stumbling off the porch. Cannot put any weight on R foot.

## 2017-11-11 NOTE — ED Provider Notes (Signed)
[facility]    CSN: [id number] Arrival date & time: 11/11/17  1000     History   Chief Complaint Chief Complaint  Patient presents with  . Foot Pain    HPI [name] [name] is a 32 y.o. female no significant past medical history presenting today after a fall with right ankle pain.  States that she had ordered a pizza and was running down some stairs and tripped on the last step and ended up rolling her ankle.  She has been unable to bear weight since.  She has a lot of pain with weightbearing, swelling.  She has been using a knee scooter/walker to ambulate.  Denies any knee pain or lateral leg pain.  Denies lack of sensation.  Denies hitting head or loss of consciousness.  HPI  Past Medical History:  Diagnosis Date  . ASD (atrial septal defect)   . Essential hypertension, benign   . Mitral valve prolapse     Patient Active Problem List   Diagnosis Date Noted  . TOBACCO ABUSE 10/15/2010  . Essential hypertension, benign 07/03/2009  . MITRAL VALVE PROLAPSE 07/03/2009  . ATRIAL SEPTAL DEFECT 07/03/2009    Past Surgical History:  Procedure Laterality Date  . ASD REPAIR  2002   [facility]   . CARDIAC SURGERY    . CHOLECYSTECTOMY  2011   [facility] - Dr. Lovell Sheehan     OB History    Gravida Para Term Preterm AB Living   0 0 0 0 0 0   SAB TAB Ectopic Multiple Live Births   0 0 0 0 0       Home Medications    Prior to Admission medications   Medication Sig Start Date End Date Taking? Authorizing Provider  ALPRAZolam Prudy Feeler) 0.5 MG tablet Take 0.5 mg by mouth at bedtime as needed.    [provider]  ibuprofen (ADVIL,MOTRIN) 200 MG tablet Take 800 mg by mouth as needed.    [provider]  misoprostol (CYTOTEC) 200 MCG tablet Take 3 tablets (600 mcg total) by mouth once for 1 dose. 2-3 hours befor your appointment for IUD insertion 11/02/17 11/02/17  Cheral Marker, CNM  UNABLE TO FIND daily. BP med-unsure of name    [provider]    Family History Family History  Problem Relation Age of Onset  . Pneumonia Paternal Grandfather   . Cancer Paternal Grandmother        skin  . Blindness Paternal Grandmother   . Other Maternal Grandmother        vertigo  . Colon cancer Maternal Grandfather   . Cancer Father        rectal and colon  . Post-traumatic stress disorder Father   . Anxiety disorder Father   . Stroke Mother   . Anxiety disorder Mother   . Cancer Sister        bladder    Social History Social History   Tobacco Use  . Smoking status: Current Every Day Smoker    Packs/day: 1.00    Years: 20.00    Pack years: 20.00    Types: Cigarettes  . Smokeless tobacco: Never Used  . Tobacco comment: pt advised she did eletric cig for 6 months until 05-15-13  Substance Use Topics  . Alcohol use: Yes    Comment: occasionally  . Drug use: No     Allergies   Amoxicillin; Other; and Penicillins   Review of Systems Review of Systems  Eyes: Negative for visual disturbance.  Respiratory: Negative for shortness of breath.   Cardiovascular: Negative for chest pain.  Gastrointestinal: Negative for nausea and vomiting.  Musculoskeletal: Positive for arthralgias, gait problem and myalgias.  Skin: Negative for pallor and wound.  Neurological: Negative for dizziness, syncope, weakness, light-headedness and headaches.     Physical Exam Triage Vital Signs ED Triage Vitals  Enc Vitals Group     BP 11/11/17 1012 95/60     Pulse Rate 11/11/17 1012 93     Resp 11/11/17 1012 18     Temp 11/11/17 1012 97.8 F (36.6 C)     Temp src --      SpO2 11/11/17 1012 97 %     Weight --      Height --      Head Circumference --      Peak Flow --      Pain Score 11/11/17 1013 6     Pain Loc --      Pain Edu? --      Excl. in GC? --    No data found.  Updated Vital Signs BP 95/60   Pulse 93   Temp 97.8 F (36.6 C)   Resp 18   LMP 10/27/2017 (Exact Date)   SpO2 97%   Visual Acuity Right Eye  Distance:   Left Eye Distance:   Bilateral Distance:    Right Eye Near:   Left Eye Near:    Bilateral Near:     Physical Exam  Constitutional: She appears well-developed and well-nourished. No distress.  HENT:  Head: Normocephalic and atraumatic.  Eyes: Conjunctivae are normal.  Neck: Neck supple.  Cardiovascular: Normal rate.  Pulmonary/Chest: Effort normal. No respiratory distress.  Musculoskeletal: She exhibits no edema.  Moderate swelling to lateral malleolus, tenderness to palpation of lateral malleolus, nontender to palpation left fibula insertion near he, patient able to wiggle big toe, cap refill less than 2, dorsalis pedis 2+.  Toes are slightly cold, comparable to the left side.  Neurological: She is alert.  Skin: Skin is warm and dry.  Psychiatric: She has a normal mood and affect.  Nursing note and vitals reviewed.    UC Treatments / Results  Labs (all labs ordered are listed, but only abnormal results are displayed) Labs Reviewed - No data to display  EKG  EKG Interpretation None       Radiology Dg Ankle Complete Right  Result Date: 11/11/2017 CLINICAL DATA:  Right ankle pain and swelling after fall last night EXAM: RIGHT ANKLE - COMPLETE 3+ VIEW COMPARISON:  None. FINDINGS: Diffuse right ankle soft tissue swelling, most prominent laterally. No fracture or subluxation. No suspicious focal osseous lesion. No arthropathy. No radiopaque foreign body. IMPRESSION: Diffuse right ankle soft tissue swelling, with no fracture or subluxation. Electronically Signed   By: Delbert Phenix M.D.   On: 11/11/2017 10:43    Procedures Procedures (including critical care time)  Medications Ordered in UC Medications - No data to display   Initial Impression / Assessment and Plan / UC Course  I have reviewed the triage vital signs and the nursing notes.  Pertinent labs & imaging results that were available during my care of the patient were reviewed by me and considered in  my medical decision making (see chart for details).     No fracture on x-ray, rest, ice, NSAIDs for pain.  Ace bandage for compression.  Patient plans to use boot at home to help with ambulation. Discussed strict  return precautions. Patient verbalized understanding and is agreeable with plan.   Final Clinical Impressions(s) / UC Diagnoses   Final diagnoses:  Sprain of right ankle, unspecified ligament, initial encounter    ED Discharge Orders    None       Controlled Substance Prescriptions [place] Controlled Substance Registry consulted? Not Applicable   Lew DawesWieters, Hallie C, [place]PA-C 11/11/17 1128

## 2017-11-11 NOTE — Discharge Instructions (Signed)
No fracture or dislocation on x-ray.  Use anti-inflammatories for pain/swelling. You may take up to 800 mg Ibuprofen every 8 hours with food. You may supplement Ibuprofen with Tylenol [phone] mg every 8 hours.   Apply ice and heat alternating every 20-30 min.  Elevate foot when at home, please use Ace bandage to apply compression to help with swelling.

## 2017-11-26 ENCOUNTER — Ambulatory Visit: Payer: BLUE CROSS/BLUE SHIELD | Admitting: Women's Health

## 2017-12-03 ENCOUNTER — Encounter: Payer: Self-pay | Admitting: Obstetrics and Gynecology

## 2017-12-03 ENCOUNTER — Ambulatory Visit: Payer: BLUE CROSS/BLUE SHIELD | Admitting: Obstetrics and Gynecology

## 2017-12-03 ENCOUNTER — Telehealth: Payer: Self-pay | Admitting: Obstetrics and Gynecology

## 2017-12-03 VITALS — BP 112/78 | HR 78 | Ht 64.0 in | Wt 210.2 lb

## 2017-12-03 DIAGNOSIS — Z30430 Encounter for insertion of intrauterine contraceptive device: Secondary | ICD-10-CM | POA: Diagnosis not present

## 2017-12-03 DIAGNOSIS — Z3202 Encounter for pregnancy test, result negative: Secondary | ICD-10-CM | POA: Diagnosis not present

## 2017-12-03 LAB — POCT URINE PREGNANCY: PREG TEST UR: NEGATIVE

## 2017-12-03 MED ORDER — CIPROFLOXACIN HCL 500 MG PO TABS: 500.0000 mg | ORAL_TABLET | Freq: Two times a day (BID) | ORAL | 0 refills | Status: DC

## 2017-12-03 MED ORDER — LEVONORGESTREL 19.5 MCG/DAY IU IUD
INTRAUTERINE_SYSTEM | Freq: Once | INTRAUTERINE | Status: AC
  Administered 2017-12-03: 13:00:00 via INTRAUTERINE

## 2017-12-03 MED ORDER — TRAMADOL HCL 50 MG PO TABS: 50.0000 mg | ORAL_TABLET | Freq: Four times a day (QID) | ORAL | 0 refills | Status: DC | PRN

## 2017-12-03 NOTE — Telephone Encounter (Signed)
Phone call to patient who states that she is having slightly less discomfort than earlier today still moderately uncomfortable after the IUD placement.  Patient to let us know if things do not improve over the weekend, and to take ibuprofen routinely throughout the weekend for discomfort.  The patient has tramadol available if needed

## 2017-12-03 NOTE — Addendum Note (Signed)
Addended by: Colen DarlingYOUNG, JANET S on: 12/03/2017 12:51 PM   Modules accepted: Orders

## 2017-12-09 ENCOUNTER — Telehealth: Payer: Self-pay | Admitting: Obstetrics and Gynecology

## 2017-12-09 NOTE — Telephone Encounter (Signed)
Called patient and discussed her concerns with her. She has been bleeding since IUD was put in last week. Advised patient that it is not unusual to have irregular bleeding for the first several months after iud is put in place. Patient voiced understanding and had no other questions. Advised patient to call back with any other concerns.

## 2017-12-09 NOTE — Telephone Encounter (Signed)
Patient called stating that she had an IUD inserted and she started her period and she would like to know how long is she suppose to be on her period with the IUD. Please contact pt

## 2017-12-16 ENCOUNTER — Ambulatory Visit: Payer: BLUE CROSS/BLUE SHIELD | Admitting: Women's Health

## 2017-12-16 ENCOUNTER — Telehealth: Payer: Self-pay | Admitting: *Deleted

## 2017-12-16 ENCOUNTER — Encounter: Payer: Self-pay | Admitting: Women's Health

## 2017-12-16 VITALS — BP 140/100 | HR 98 | Wt 213.0 lb

## 2017-12-16 DIAGNOSIS — Z30431 Encounter for routine checking of intrauterine contraceptive device: Secondary | ICD-10-CM

## 2017-12-16 DIAGNOSIS — Z975 Presence of (intrauterine) contraceptive device: Secondary | ICD-10-CM

## 2017-12-16 DIAGNOSIS — N921 Excessive and frequent menstruation with irregular cycle: Secondary | ICD-10-CM | POA: Diagnosis not present

## 2017-12-16 DIAGNOSIS — N9489 Other specified conditions associated with female genital organs and menstrual cycle: Secondary | ICD-10-CM

## 2017-12-16 MED ORDER — NAPROXEN SODIUM 550 MG PO TABS: 550.0000 mg | ORAL_TABLET | Freq: Two times a day (BID) | ORAL | 1 refills | Status: DC | PRN

## 2017-12-16 NOTE — Telephone Encounter (Signed)
Spoke with pt. Pt got IUD placed on 3/22 and she is having bad cramps and bleeding. I advised bleeding can be common the first 6 months but the terrible cramping is a concern. Advised she needs to be seen to check placement. Pt voiced understanding. Call transferred to front desk for appt. JSY

## 2017-12-16 NOTE — Progress Notes (Signed)
   GYN VISIT Patient name: [name] [name] MRN [id number]  Date of birth: 07/08/1986 Chief Complaint:   work-in-gyn (cramps and bleeding/ iud inserted 12-03-17)  History of Present Illness:   [name] [name] is a 32 y.o. Caucasian female being seen today for report of vaginal bleeding and intermittent cramping since IUD placed 12/03/17.  Ibuprofen helps some. Cramping was pretty intense right after insertion, then had her in tears today. Has been emotional. Is not sure she wants it removed, just wants to make sure it is where it needs to be.     Patient's last menstrual period was 12/02/2017. The current method of family planning is IUD. Last pap Jan 2019. Results were:  normal Review of Systems:   Pertinent items are noted in HPI Denies fever/chills, dizziness, headaches, visual disturbances, fatigue, shortness of breath, chest pain, abdominal pain, vomiting, abnormal vaginal discharge/itching/odor/irritation, problems with periods, bowel movements, urination, or intercourse unless otherwise stated above.  Pertinent History Reviewed:  Reviewed past medical,surgical, social, obstetrical and family history.  Reviewed problem list, medications and allergies. Physical Assessment:   Vitals:   12/16/17 1334  BP: (!) 140/100  Pulse: 98  Weight: 213 lb (96.6 kg)  Body mass index is 36.56 kg/m.       Physical Examination:   General appearance: alert, well appearing, and in no distress  Mental status: alert, oriented to person, place, and time  Skin: warm & dry   Cardiovascular: normal heart rate noted  Respiratory: normal respiratory effort, no distress  Abdomen: soft, non-tender   Pelvic: VULVA: normal appearing vulva with no masses, tenderness or lesions, VAGINA: normal appearing vagina with normal color and discharge, no lesions, CERVIX: normal appearing cervix without discharge or lesions, IUD strings visible and appropriate length.   Extremities: no edema   Informal transvaginal  u/s: IUD in correct fundal placement w/in endometrium  No results found for this or any previous visit (from the past 24 hour(s)).  Assessment & Plan:  1) Vaginal bleeding, cramping w/ IUD> IUD in correct placement, discussed typical abnormal bleeding patterns x 3-566mths after insertion. Rx anaprox for cramping. Let us know if decides she wants it out  Meds:  Meds ordered this encounter  Medications  . naproxen sodium (ANAPROX DS) 550 MG tablet    Sig: Take 1 tablet (550 mg total) by mouth 2 (two) times daily as needed. With meal    Dispense:  30 tablet    Refill:  1    Order Specific Question:   Supervising Provider    Answer:   Despina HiddenEURE, LUTHER H [[id number]]    No orders of the defined types were placed in this encounter.   Return for As scheduled.  Cheral MarkerKimberly R Sheril Hammond CNM, [facility]NP-BC 12/16/2017 2:36 PM

## 2017-12-29 ENCOUNTER — Ambulatory Visit: Payer: BLUE CROSS/BLUE SHIELD | Admitting: Obstetrics and Gynecology

## 2018-01-13 ENCOUNTER — Encounter: Payer: Self-pay | Admitting: Women's Health

## 2018-01-13 ENCOUNTER — Ambulatory Visit: Payer: BLUE CROSS/BLUE SHIELD | Admitting: Women's Health

## 2018-01-13 ENCOUNTER — Other Ambulatory Visit: Payer: Self-pay

## 2018-01-13 VITALS — BP 138/86 | HR 82 | Ht 64.0 in | Wt 214.0 lb

## 2018-01-13 DIAGNOSIS — Z30432 Encounter for removal of intrauterine contraceptive device: Secondary | ICD-10-CM

## 2018-01-13 DIAGNOSIS — N920 Excessive and frequent menstruation with regular cycle: Secondary | ICD-10-CM

## 2018-01-13 NOTE — Progress Notes (Signed)
   IUD REMOVAL  Patient name: [name] [name] MRN [id number]  Date of birth: February 26, 1986 Subjective Findings:   [name] [name] is a 32 y.o. G0P0000 Caucasian female being seen today for removal of a Liletta IUD. Her IUD was placed 12/03/17.  She desires removal because continued pain and bleeding. Has bled for 41 days straight. Is not interested in megace to help stop the bleeding. Contemplating something permanent, is interested in ablation b/c she usually has heavy/painful periods. Wants to discuss further w/ husband as they do not have children. Discussed getting pelvic u/s to r/o fibroids, etc, then meeting w/ MD after. Signed copy of informed consent in chart.   No LMP recorded. (Menstrual status: IUD). Last papJan 2019. Results were:  normal The planned method of family planning is condoms  Pertinent History Reviewed:   Reviewed past medical,surgical, social, obstetrical and family history.  Reviewed problem list, medications and allergies. Objective Findings & Procedure:    Vitals:   01/13/18 1508  BP: 138/86  Pulse: 82  Weight: 214 lb (97.1 kg)  Height:  (1.626 m)  Body mass index is 36.73 kg/m.  No results found for this or any previous visit (from the past 24 hour(s)).   Time out was performed.  A pederson speculum was placed in the vagina.  The cervix was visualized, and the strings were visible. They were grasped and the Liletta IUD was easily removed intact without complications. The patient tolerated the procedure well.  Assessment & Plan:   1) Liletta IUD removal>Follow-up prn problems  2) Menorrhagia w/ dysmenorrhea> when not on birth control. Call [place] if decides for pelvic u/s for menorrhagia- to schedule once period goes off. Gave pamphlet on Minerva, and info on endometrial ablation. Plans to use condoms for now.    No orders of the defined types were placed in this encounter.   Follow-up: Return for pt will call if she decides for u/s.  Cheral Marker  CNM, [facility] 01/13/2018 3:44 PM

## 2018-01-13 NOTE — Patient Instructions (Addendum)
Call us to schedule ultrasound for when your period goes off (if you decide you want one)  Endometrial Ablation Endometrial ablation is a procedure that destroys the thin inner layer of the lining of the uterus (endometrium). This procedure may be done:  To stop heavy periods.  To stop bleeding that is causing anemia.  To control irregular bleeding.  To treat bleeding caused by small tumors (fibroids) in the endometrium.  This procedure is often an alternative to major surgery, such as removal of the uterus and cervix (hysterectomy). As a result of this procedure:  You may not be able to have children. However, if you are premenopausal (you have not gone through menopause): ? You may still have a small chance of getting pregnant. ? You will need to use a reliable method of birth control after the procedure to prevent pregnancy.  You may stop having a menstrual period, or you may have only a small amount of bleeding during your period. Menstruation may return several years after the procedure.  Tell a health care provider about:  Any allergies you have.  All medicines you are taking, including vitamins, herbs, eye drops, creams, and over-the-counter medicines.  Any problems you or family members have had with the use of anesthetic medicines.  Any blood disorders you have.  Any surgeries you have had.  Any medical conditions you have. What are the risks? Generally, this is a safe procedure. However, problems may occur, including:  A hole (perforation) in the uterus or bowel.  Infection of the uterus, bladder, or vagina.  Bleeding.  Damage to other structures or organs.  An air bubble in the lung (air embolus).  Problems with pregnancy after the procedure.  Failure of the procedure.  Decreased ability to diagnose cancer in the endometrium.  What happens before the procedure?  You will have tests of your endometrium to make sure there are no pre-cancerous cells or  cancer cells present.  You may have an ultrasound of the uterus.  You may be given medicines to thin the endometrium.  Ask your health care provider about: ? Changing or stopping your regular medicines. This is especially important if you take diabetes medicines or blood thinners. ? Taking medicines such as aspirin and ibuprofen. These medicines can thin your blood. Do not take these medicines before your procedure if your doctor tells you not to.  Plan to have someone take you home from the hospital or clinic. What happens during the procedure?  You will lie on an exam table with your feet and legs supported as in a pelvic exam.  To lower your risk of infection: ? Your health care team will wash or sanitize their hands and put on germ-free (sterile) gloves. ? Your genital area will be washed with soap.  An IV tube will be inserted into one of your veins.  You will be given a medicine to help you relax (sedative).  A surgical instrument with a light and camera (resectoscope) will be inserted into your vagina and moved into your uterus. This allows your surgeon to see inside your uterus.  Endometrial tissue will be removed using one of the following methods: ? Radiofrequency. This method uses a radiofrequency-alternating electric current to remove the endometrium. ? Cryotherapy. This method uses extreme cold to freeze the endometrium. ? Heated-free liquid. This method uses a heated saltwater (saline) solution to remove the endometrium. ? Microwave. This method uses high-energy microwaves to heat up the endometrium and remove it. ? Thermal  balloon. This method involves inserting a catheter with a balloon tip into the uterus. The balloon tip is filled with heated fluid to remove the endometrium. The procedure may vary among health care providers and hospitals. What happens after the procedure?  Your blood pressure, heart rate, breathing rate, and blood oxygen level will be monitored  until the medicines you were given have worn off.  As tissue healing occurs, you may notice vaginal bleeding for 4-6 weeks after the procedure. You may also experience: ? Cramps. ? Thin, watery vaginal discharge that is light pink or brown in color. ? A need to urinate more frequently than usual. ? Nausea.  Do not drive for 24 hours if you were given a sedative.  Do not have sex or insert anything into your vagina until your health care provider approves. Summary  Endometrial ablation is done to treat the many causes of heavy menstrual bleeding.  The procedure may be done only after medications have been tried to control the bleeding.  Plan to have someone take you home from the hospital or clinic. This information is not intended to replace advice given to you by your health care provider. Make sure you discuss any questions you have with your health care provider. Document Released: 07/10/2004 Document Revised: 09/17/2016 Document Reviewed: 09/17/2016 Elsevier Interactive Patient Education  2017 [employer].

## 2018-09-12 DIAGNOSIS — Z6835 Body mass index (BMI) 35.0-35.9, adult: Secondary | ICD-10-CM | POA: Diagnosis not present

## 2018-09-12 DIAGNOSIS — J343 Hypertrophy of nasal turbinates: Secondary | ICD-10-CM | POA: Diagnosis not present

## 2018-09-12 DIAGNOSIS — J029 Acute pharyngitis, unspecified: Secondary | ICD-10-CM | POA: Diagnosis not present

## 2018-09-12 DIAGNOSIS — R05 Cough: Secondary | ICD-10-CM | POA: Diagnosis not present

## 2018-09-12 DIAGNOSIS — I10 Essential (primary) hypertension: Secondary | ICD-10-CM | POA: Diagnosis not present

## 2019-07-13 DIAGNOSIS — J069 Acute upper respiratory infection, unspecified: Secondary | ICD-10-CM | POA: Diagnosis not present

## 2019-07-19 ENCOUNTER — Other Ambulatory Visit: Payer: Self-pay | Admitting: *Deleted

## 2019-07-19 DIAGNOSIS — Z20822 Contact with and (suspected) exposure to covid-19: Secondary | ICD-10-CM

## 2019-07-20 LAB — NOVEL CORONAVIRUS, NAA: SARS-CoV-2, NAA: NOT DETECTED

## 2019-12-07 DIAGNOSIS — Z6836 Body mass index (BMI) 36.0-36.9, adult: Secondary | ICD-10-CM | POA: Diagnosis not present

## 2019-12-07 DIAGNOSIS — F419 Anxiety disorder, unspecified: Secondary | ICD-10-CM | POA: Diagnosis not present

## 2019-12-07 DIAGNOSIS — Z01411 Encounter for gynecological examination (general) (routine) with abnormal findings: Secondary | ICD-10-CM | POA: Diagnosis not present

## 2019-12-07 DIAGNOSIS — Z1389 Encounter for screening for other disorder: Secondary | ICD-10-CM | POA: Diagnosis not present

## 2019-12-07 DIAGNOSIS — E6609 Other obesity due to excess calories: Secondary | ICD-10-CM | POA: Diagnosis not present

## 2019-12-07 DIAGNOSIS — Z124 Encounter for screening for malignant neoplasm of cervix: Secondary | ICD-10-CM | POA: Diagnosis not present

## 2019-12-07 DIAGNOSIS — N920 Excessive and frequent menstruation with regular cycle: Secondary | ICD-10-CM | POA: Diagnosis not present

## 2019-12-07 DIAGNOSIS — Z1151 Encounter for screening for human papillomavirus (HPV): Secondary | ICD-10-CM | POA: Diagnosis not present

## 2020-01-08 ENCOUNTER — Encounter: Payer: Self-pay | Admitting: Obstetrics & Gynecology

## 2020-01-08 ENCOUNTER — Ambulatory Visit (INDEPENDENT_AMBULATORY_CARE_PROVIDER_SITE_OTHER): Payer: BC Managed Care – PPO | Admitting: Obstetrics & Gynecology

## 2020-01-08 ENCOUNTER — Other Ambulatory Visit: Payer: Self-pay

## 2020-01-08 VITALS — BP 125/87 | HR 80 | Ht 64.0 in | Wt 206.0 lb

## 2020-01-08 DIAGNOSIS — N946 Dysmenorrhea, unspecified: Secondary | ICD-10-CM

## 2020-01-08 DIAGNOSIS — N920 Excessive and frequent menstruation with regular cycle: Secondary | ICD-10-CM

## 2020-01-08 DIAGNOSIS — Z3169 Encounter for other general counseling and advice on procreation: Secondary | ICD-10-CM | POA: Diagnosis not present

## 2020-01-08 MED ORDER — TRANEXAMIC ACID 650 MG PO TABS: 650.0000 mg | ORAL_TABLET | Freq: Three times a day (TID) | ORAL | 11 refills | Status: DC

## 2020-01-08 MED ORDER — KETOROLAC TROMETHAMINE 10 MG PO TABS
10.0000 mg | ORAL_TABLET | Freq: Three times a day (TID) | ORAL | 0 refills | Status: DC | PRN
Start: 2020-01-08 — End: 2020-04-02

## 2020-01-08 NOTE — Progress Notes (Addendum)
Chief Complaint  Patient presents with  . discuss endometriosis    infertility      34 y.o. G0P0000 Patient's last menstrual period was 12/27/2019. The current method of family planning is none.  Outpatient Encounter Medications as of 01/08/2020  Medication Sig  . ALPRAZolam (XANAX) 0.5 MG tablet Take 0.5 mg by mouth at bedtime as needed.  Marland Kitchen HYDROcodone-acetaminophen (NORCO) 7.5-325 MG tablet Take 1 tablet by mouth every 6 (six) hours as needed for moderate pain.  Marland Kitchen ibuprofen (ADVIL,MOTRIN) 200 MG tablet Take 800 mg by mouth as needed.  Marland Kitchen ketorolac (TORADOL) 10 MG tablet Take 1 tablet (10 mg total) by mouth every 8 (eight) hours as needed.  Marland Kitchen lisinopril (PRINIVIL,ZESTRIL) 10 MG tablet Take 10 mg by mouth daily.  . naproxen sodium (ANAPROX DS) 550 MG tablet Take 1 tablet (550 mg total) by mouth 2 (two) times daily as needed. With meal (Patient not taking: Reported on 01/08/2020)  . traMADol (ULTRAM) 50 MG tablet Take 1 tablet (50 mg total) by mouth every 6 (six) hours as needed for moderate pain or severe pain. (Patient not taking: Reported on 12/16/2017)  . tranexamic acid (LYSTEDA) 650 MG TABS tablet Take 1 tablet (650 mg total) by mouth 3 (three) times daily.   No facility-administered encounter medications on file as of 01/08/2020.    Subjective Pt with worsening menstrual periods 8 days bleeding heavy with clots and severe pain Has missed work at times No pain with intercourse Has been on some type of hormonal based BCM for years up until 5/19  Regular intercourse 3 per week average No pain Up until 4 months ago was still using early withdrawal method so only 4 months unprotected    Past Medical History:  Diagnosis Date  . ASD (atrial septal defect)   . Essential hypertension, benign   . Mitral valve prolapse     Past Surgical History:  Procedure Laterality Date  . ASD REPAIR  2002   [facility]   . CARDIAC SURGERY    . CHOLECYSTECTOMY  2011   [facility] - Dr.  Arnoldo Morale     OB History    Gravida  0   Para  0   Term  0   Preterm  0   AB  0   Living  0     SAB  0   TAB  0   Ectopic  0   Multiple  0   Live Births  0           Allergies  Allergen Reactions  . Amoxicillin   . Other     Ivory soap breaks her out in a rash  . Penicillins     Social History   Socioeconomic History  . Marital status: Single    Spouse name: Not on file  . Number of children: Not on file  . Years of education: Not on file  . Highest education level: Not on file  Occupational History  . Occupation: Surveyor, mining   Tobacco Use  . Smoking status: Current Every Day Smoker    Packs/day: 1.00    Years: 20.00    Pack years: 20.00    Types: Cigarettes  . Smokeless tobacco: Never Used  . Tobacco comment: pt advised she did eletric cig for 6 months until 05-15-13  Substance and Sexual Activity  . Alcohol use: Yes    Comment: occasionally  . Drug use: No  . Sexual activity:  Yes    Birth control/protection: Condom, I.U.D.  Other Topics Concern  . Not on file  Social History Narrative               Social Determinants of Health   Financial Resource Strain:   . Difficulty of Paying Living Expenses:   Food Insecurity:   . Worried About Programme researcher, broadcasting/film/video in the Last Year:   . Barista in the Last Year:   Transportation Needs:   . Freight forwarder (Medical):   Marland Kitchen Lack of Transportation (Non-Medical):   Physical Activity:   . Days of Exercise per Week:   . Minutes of Exercise per Session:   Stress:   . Feeling of Stress :   Social Connections:   . Frequency of Communication with Friends and Family:   . Frequency of Social Gatherings with Friends and Family:   . Attends Religious Services:   . Active Member of Clubs or Organizations:   . Attends Banker Meetings:   Marland Kitchen Marital Status:     Family History  Problem Relation Age of Onset  . Pneumonia Paternal Grandfather   . Colon cancer Paternal  Grandfather   . Cancer Paternal Grandmother        skin  . Blindness Paternal Grandmother   . Other Maternal Grandmother        vertigo  . Hypertension Maternal Grandmother   . Rheum arthritis Maternal Grandmother   . Anxiety disorder Maternal Grandmother   . Colon cancer Maternal Grandfather   . Cancer Father        rectal and colon  . Post-traumatic stress disorder Father   . Anxiety disorder Father   . Stroke Mother   . Anxiety disorder Mother   . Cancer Sister        bladder  . Migraines Maternal Aunt   . Colon cancer Maternal Aunt   . Cervical cancer Maternal Aunt     Medications:       Current Outpatient Medications:  .  ALPRAZolam (XANAX) 0.5 MG tablet, Take 0.5 mg by mouth at bedtime as needed., Disp: , Rfl:  .  HYDROcodone-acetaminophen (NORCO) 7.5-325 MG tablet, Take 1 tablet by mouth every 6 (six) hours as needed for moderate pain., Disp: , Rfl:  .  ibuprofen (ADVIL,MOTRIN) 200 MG tablet, Take 800 mg by mouth as needed., Disp: , Rfl:  .  ketorolac (TORADOL) 10 MG tablet, Take 1 tablet (10 mg total) by mouth every 8 (eight) hours as needed., Disp: 15 tablet, Rfl: 0 .  lisinopril (PRINIVIL,ZESTRIL) 10 MG tablet, Take 10 mg by mouth daily., Disp: , Rfl:  .  naproxen sodium (ANAPROX DS) 550 MG tablet, Take 1 tablet (550 mg total) by mouth 2 (two) times daily as needed. With meal (Patient not taking: Reported on 01/08/2020), Disp: 30 tablet, Rfl: 1 .  traMADol (ULTRAM) 50 MG tablet, Take 1 tablet (50 mg total) by mouth every 6 (six) hours as needed for moderate pain or severe pain. (Patient not taking: Reported on 12/16/2017), Disp: 12 tablet, Rfl: 0 .  tranexamic acid (LYSTEDA) 650 MG TABS tablet, Take 1 tablet (650 mg total) by mouth 3 (three) times daily., Disp: 15 tablet, Rfl: 11  Objective Blood pressure 125/87, pulse 80, height 5\' 4"  (1.626 m), weight 206 lb (93.4 kg), last menstrual period 12/27/2019.  Gen WDWN NAD  Pertinent ROS No burning with urination, frequency  or urgency No nausea, vomiting or diarrhea Nor fever chills  or other constitutional symptoms   Labs or studies No new    Impression Diagnoses this Encounter::   ICD-10-CM   1. Menorrhagia with regular cycle  N92.0   2. Dysmenorrhea  N94.6   3. Infertility counseling  Z31.69     Established relevant diagnosis(es):   Plan/Recommendations: Meds ordered this encounter  Medications  . tranexamic acid (LYSTEDA) 650 MG TABS tablet    Sig: Take 1 tablet (650 mg total) by mouth 3 (three) times daily.    Dispense:  15 tablet    Refill:  11  . ketorolac (TORADOL) 10 MG tablet    Sig: Take 1 tablet (10 mg total) by mouth every 8 (eight) hours as needed.    Dispense:  15 tablet    Refill:  0    Labs or Scans Ordered: No orders of the defined types were placed in this encounter.   Management:: >Patient desires pregnancy as well so hormonal management of her menorrhagia/dysmenorrhea are not options >will manage the bleeding event and see if that helps volume/duration/associated pain  Recommend OPT to time intercourse, baseline 3/week  If not pregnant in 9 months(13 months of no BCM) then will check semen analysis and tubal patency and, if those are normal, refer to REI MD  Pt wil contact via MyChart regarding her menstrual cycles if needed and if no pregnancy achieved contat 1/22  Follow up Return if symptoms worsen or fail to improve.        Face to face time:  20 minutes  Greater than 50% of the visit time was spent in counseling and coordination of care with the patient.  The summary and outline of the counseling and care coordination is summarized in the note above.   All questions were answered.

## 2020-01-08 NOTE — Addendum Note (Signed)
Addended by: Lazaro Arms on: 01/08/2020 09:47 AM   Modules accepted: Level of Service

## 2020-04-02 ENCOUNTER — Other Ambulatory Visit: Payer: Self-pay | Admitting: Women's Health

## 2020-04-02 ENCOUNTER — Other Ambulatory Visit: Payer: Self-pay

## 2020-04-02 ENCOUNTER — Encounter: Payer: Self-pay | Admitting: *Deleted

## 2020-04-02 ENCOUNTER — Ambulatory Visit (INDEPENDENT_AMBULATORY_CARE_PROVIDER_SITE_OTHER): Payer: BC Managed Care – PPO | Admitting: *Deleted

## 2020-04-02 ENCOUNTER — Telehealth: Payer: Self-pay | Admitting: *Deleted

## 2020-04-02 VITALS — BP 135/89 | HR 67 | Ht 64.0 in | Wt 196.0 lb

## 2020-04-02 DIAGNOSIS — Z3201 Encounter for pregnancy test, result positive: Secondary | ICD-10-CM

## 2020-04-02 LAB — POCT URINE PREGNANCY: Preg Test, Ur: POSITIVE — AB

## 2020-04-02 MED ORDER — BONJESTA 20-20 MG PO TBCR
1.0000 | EXTENDED_RELEASE_TABLET | Freq: Every day | ORAL | 8 refills | Status: DC
Start: 2020-04-02 — End: 2020-05-24

## 2020-04-02 NOTE — Progress Notes (Addendum)
   NURSE VISIT- PREGNANCY CONFIRMATION   SUBJECTIVE:  [name] [name] is a 34 y.o. G1P0000 female at [redacted]w[redacted]d by certain LMP of Patient's last menstrual period was 02/23/2020 (exact date). Here for pregnancy confirmation.  Home pregnancy test: positive x four  She reports nausea.  She is taking prenatal vitamins.    OBJECTIVE:  BP 135/89 (BP Location: Left Arm, Patient Position: Sitting, Cuff Size: Normal)   Pulse 67   Ht 5\' 4"  (1.626 m)   Wt 196 lb (88.9 kg)   LMP 02/23/2020 (Exact Date)   BMI 33.64 kg/m   Appears well, in no apparent distress OB History  Gravida Para Term Preterm AB Living  1 0 0 0 0 0  SAB TAB Ectopic Multiple Live Births  0 0 0 0 0    # Outcome Date GA Lbr Len/2nd Weight Sex Delivery Anes PTL Lv  1 Current             No results found for this or any previous visit (from the past 24 hour(s)).  ASSESSMENT: Positive pregnancy test, [redacted]w[redacted]d by LMP . Patient was taking xanax for anxiety and now has quit smoking. Patient states anxiety is bad. Would like prescription for anxiety.  PLAN: Schedule for dating [redacted]w[redacted]d in 2 weeks.  Prenatal vitamins: continue   Nausea medicines: requested-note routed to [place], CNM to send prescription   OB packet given: Yes  Cheral Marker  04/02/2020 9:44 AM   Chart reviewed for nurse visit. Agree with plan of care. Rx 04/04/2020 to [place] in [place]. Will need appt for anxiety meds.  [place], Cheral Marker 04/02/2020 9:52 AM

## 2020-04-02 NOTE — Telephone Encounter (Signed)
Telephoned patient at home number and advised nausea medication would be coming through the mail. Also, advised would need to schedule appointment for anxiety. Patient voiced understanding and appointment scheduled.

## 2020-04-16 ENCOUNTER — Other Ambulatory Visit: Payer: Self-pay | Admitting: Obstetrics & Gynecology

## 2020-04-16 DIAGNOSIS — O3680X0 Pregnancy with inconclusive fetal viability, not applicable or unspecified: Secondary | ICD-10-CM

## 2020-04-17 ENCOUNTER — Telehealth: Payer: Self-pay | Admitting: Women's Health

## 2020-04-17 ENCOUNTER — Ambulatory Visit (INDEPENDENT_AMBULATORY_CARE_PROVIDER_SITE_OTHER): Payer: BC Managed Care – PPO

## 2020-04-17 DIAGNOSIS — O468X3 Other antepartum hemorrhage, third trimester: Secondary | ICD-10-CM

## 2020-04-17 DIAGNOSIS — Z3A01 Less than 8 weeks gestation of pregnancy: Secondary | ICD-10-CM

## 2020-04-17 DIAGNOSIS — O3680X0 Pregnancy with inconclusive fetal viability, not applicable or unspecified: Secondary | ICD-10-CM | POA: Diagnosis not present

## 2020-04-17 NOTE — Telephone Encounter (Signed)
Patient called stating that she had an [place] earlier this morning and she went into her Mychart and seen something on the progress notes from the [place] that has her concern. Pt states that she went on [employer] to look some of the stuff up and now she is scared. Please contact pt

## 2020-04-17 NOTE — Progress Notes (Signed)
[place] 7+5 wks,single IUP with YS,FHR 141 bpm,crl 10.81 mm,normal ovaries,mult fibroids (#1) mid right subserosal fibroid 3.1 x 3.1 x 2.7 cm,(#2) anterior intramural 2.3 x 1.9 x 2.5 cm,(#3) posterior subserosal 2.6 x 2.1  1.8 cm,subchorionic hemorrhage 3.6 x 1.6 x 2.7 cm

## 2020-04-22 ENCOUNTER — Telehealth: Payer: Self-pay | Admitting: Obstetrics & Gynecology

## 2020-04-22 NOTE — Telephone Encounter (Signed)
Telephoned patient at home number. Advised patient of what the NT/IT ultrasound included. Patient wanted CT for [facility] testing. Advised patient could check their website. Patient voiced understanding.

## 2020-04-22 NOTE — Telephone Encounter (Signed)
Patient wants some clarification on exactly what the NT/IT ultrasound checks for. Gave patient cpt code 15830 so that she is able to check her benefits for testing with ins. Did advise patient that there is separate codes from the lab and shell have to check with the lab on those cpt codes. Patient verbalized understanding.

## 2020-05-21 ENCOUNTER — Other Ambulatory Visit: Payer: Self-pay | Admitting: Obstetrics & Gynecology

## 2020-05-21 DIAGNOSIS — O0990 Supervision of high risk pregnancy, unspecified, unspecified trimester: Secondary | ICD-10-CM | POA: Insufficient documentation

## 2020-05-21 DIAGNOSIS — Z3682 Encounter for antenatal screening for nuchal translucency: Secondary | ICD-10-CM

## 2020-05-22 ENCOUNTER — Encounter: Payer: BC Managed Care – PPO | Admitting: Advanced Practice Midwife

## 2020-05-22 ENCOUNTER — Ambulatory Visit: Payer: BC Managed Care – PPO | Admitting: *Deleted

## 2020-05-22 ENCOUNTER — Other Ambulatory Visit: Payer: BC Managed Care – PPO

## 2020-05-22 ENCOUNTER — Other Ambulatory Visit: Payer: Self-pay | Admitting: Obstetrics & Gynecology

## 2020-05-22 ENCOUNTER — Ambulatory Visit (INDEPENDENT_AMBULATORY_CARE_PROVIDER_SITE_OTHER): Payer: BC Managed Care – PPO

## 2020-05-22 DIAGNOSIS — O0991 Supervision of high risk pregnancy, unspecified, first trimester: Secondary | ICD-10-CM

## 2020-05-22 DIAGNOSIS — O021 Missed abortion: Secondary | ICD-10-CM | POA: Diagnosis not present

## 2020-05-22 DIAGNOSIS — O3680X0 Pregnancy with inconclusive fetal viability, not applicable or unspecified: Secondary | ICD-10-CM

## 2020-05-22 DIAGNOSIS — Z3A12 12 weeks gestation of pregnancy: Secondary | ICD-10-CM | POA: Diagnosis not present

## 2020-05-22 DIAGNOSIS — Z3682 Encounter for antenatal screening for nuchal translucency: Secondary | ICD-10-CM

## 2020-05-22 NOTE — Progress Notes (Signed)
[place] 9+5 wks,single IUP,no fetal heart tones,crl 29.86 mm,normal ovaries,mult fibroids n/c,(#1) fundal 3.3 x 2.4 x 3.2,(#2) anterior 3.1 x 2.4 x 2.8 cm,(#3) posterior 2.6 x 2.7 x 1.7 cm,[name] discussed results with pt.

## 2020-05-23 ENCOUNTER — Other Ambulatory Visit: Payer: Self-pay | Admitting: Obstetrics and Gynecology

## 2020-05-23 ENCOUNTER — Other Ambulatory Visit (HOSPITAL_COMMUNITY): Payer: BC Managed Care – PPO

## 2020-05-23 ENCOUNTER — Other Ambulatory Visit (HOSPITAL_COMMUNITY)
Admission: RE | Admit: 2020-05-23 | Discharge: 2020-05-23 | Disposition: A | Discharge status: Home / Self Care | Payer: BC Managed Care – PPO | Source: Ambulatory Visit | Attending: Obstetrics and Gynecology | Admitting: Obstetrics and Gynecology

## 2020-05-23 ENCOUNTER — Other Ambulatory Visit: Payer: Self-pay

## 2020-05-23 ENCOUNTER — Encounter (HOSPITAL_COMMUNITY)
Admission: RE | Admit: 2020-05-23 | Discharge: 2020-05-23 | Disposition: A | Discharge status: Home / Self Care | Payer: BC Managed Care – PPO | Source: Ambulatory Visit | Attending: Obstetrics and Gynecology | Admitting: Obstetrics and Gynecology

## 2020-05-23 DIAGNOSIS — Z20822 Contact with and (suspected) exposure to covid-19: Secondary | ICD-10-CM | POA: Diagnosis not present

## 2020-05-23 DIAGNOSIS — Z01812 Encounter for preprocedural laboratory examination: Secondary | ICD-10-CM | POA: Insufficient documentation

## 2020-05-23 LAB — CBC
Hematocrit: 42.5 % (ref 34.0–46.6)
Hemoglobin: 14.3 g/dL (ref 11.1–15.9)
MCH: 28.9 pg (ref 26.6–33.0)
MCHC: 33.6 g/dL (ref 31.5–35.7)
MCV: 86 fL (ref 79–97)
Platelets: 196 10*3/uL (ref 150–450)
RBC: 4.95 x10E6/uL (ref 3.77–5.28)
RDW: 12.2 % (ref 11.7–15.4)
WBC: 12.2 10*3/uL — ABNORMAL HIGH (ref 3.4–10.8)

## 2020-05-23 LAB — BASIC METABOLIC PANEL
BUN/Creatinine Ratio: 15 (ref 9–23)
BUN: 11 mg/dL (ref 6–20)
CO2: 25 mmol/L (ref 20–29)
Calcium: 9.8 mg/dL (ref 8.7–10.2)
Chloride: 102 mmol/L (ref 96–106)
Creatinine, Ser: 0.74 mg/dL (ref 0.57–1.00)
GFR calc Af Amer: 121 mL/min/{1.73_m2} (ref >59)
GFR calc non Af Amer: 105 mL/min/{1.73_m2} (ref >59)
Glucose: 100 mg/dL — ABNORMAL HIGH (ref 65–99)
Potassium: 4 mmol/L (ref 3.5–5.2)
Sodium: 140 mmol/L (ref 134–144)

## 2020-05-23 LAB — SARS CORONAVIRUS 2 (TAT 6-24 HRS): SARS Coronavirus 2: NEGATIVE

## 2020-05-23 LAB — ABO/RH: Rh Factor: POSITIVE

## 2020-05-23 NOTE — Progress Notes (Signed)
[name] [name] is an 34 y.o. female. She is a G1P0000 at [redacted]w[redacted]d By LMP with recent u/s indicating missed Ab with fetal pole at 9-10 weeks CRL and ABSENT Fetal heart activity, She had FHR noted at 6 wk dating u/s, no problems with bleeding or overall health. ] She was scheduled for NT testing and missed ab was diagnosed., confirmed by me by u/s review.  Pertinent Gynecological History: Menses: flow is moderate Bleeding:  Contraception:  DES exposure:  Blood transfusions: none Sexually transmitted diseases: no past history Previous GYN Procedures:   Last mammogram: normal Date:  Last pap: undated Date:  OB History: G1, P0   Menstrual History: Menarche age:  Patient's last menstrual period was 02/23/2020 (exact date).    Past Medical History:  Diagnosis Date  . ASD (atrial septal defect)   . Essential hypertension, benign   . Mitral valve prolapse     Past Surgical History:  Procedure Laterality Date  . ASD REPAIR  2002   [facility]   . CARDIAC SURGERY    . CHOLECYSTECTOMY  2011   [facility] - Dr. Lovell Sheehan     Family History  Problem Relation Age of Onset  . Pneumonia Paternal Grandfather   . Colon cancer Paternal Grandfather   . Cancer Paternal Grandmother        skin  . Blindness Paternal Grandmother   . Other Maternal Grandmother        vertigo  . Hypertension Maternal Grandmother   . Rheum arthritis Maternal Grandmother   . Anxiety disorder Maternal Grandmother   . Colon cancer Maternal Grandfather   . Cancer Father        rectal and colon  . Post-traumatic stress disorder Father   . Anxiety disorder Father   . Stroke Mother   . Anxiety disorder Mother   . Cancer Sister        bladder  . Migraines Maternal Aunt   . Colon cancer Maternal Aunt   . Cervical cancer Maternal Aunt     Social History:  reports that she quit smoking about 8 weeks ago. Her smoking use included cigarettes. She has a 20.00 pack-year smoking history. She has never used smokeless  tobacco. She reports current alcohol use. She reports that she does not use drugs.  Allergies:  Allergies  Allergen Reactions  . Amoxicillin Hives    Childhood  . Other     Ivory soap breaks her out in a rash  . Penicillins Hives    Childhood    (Not in a hospital admission)   Review of Systems .vsr   Last menstrual period 02/23/2020. Physical Exam HENT:     Head: Normocephalic.  Cardiovascular:     Rate and Rhythm: Normal rate and regular rhythm.  Pulmonary:     Effort: Pulmonary effort is normal.  Abdominal:     General: Abdomen is flat.  Skin:    Comments: Normal gait, activity    TECHNICIAN COMMENTS:  [place] 9+5 wks,single IUP,no fetal heart tones,crl 29.86 mm,normal ovaries,mult fibroids n/c,(#1) fundal 3.3 x 2.4 x 3.2,(#2) anterior 3.1 x 2.4 x 2.8 cm,(#3) posterior 2.6 x 2.7 x 1.7 cm,[name] [name] discussed results with pt.        A copy of this report including all images has been saved and backed up to a second source for retrieval if needed. All measures and details of the anatomical scan, placentation, fluid volume and pelvic anatomy are contained in that report.  [name] [name]  05/22/2020 2:52 PM   CBC    Component Value Date/Time   WBC 12.2 (H) 05/22/2020 1537   WBC 8.1 10/08/2009 0516   RBC 4.95 05/22/2020 1537   RBC 4.26 10/08/2009 0516   HGB 14.3 05/22/2020 1537   HCT 42.5 05/22/2020 1537   PLT 196 05/22/2020 1537   MCV 86 05/22/2020 1537   MCH 28.9 05/22/2020 1537   MCHC 33.6 05/22/2020 1537   MCHC 34.8 10/08/2009 0516   RDW 12.2 05/22/2020 1537   LYMPHSABS 1.9 10/08/2009 0516   MONOABS 0.7 10/08/2009 0516   EOSABS 0.2 10/08/2009 0516   BASOSABS 0.0 10/08/2009 0516   BMET    Component Value Date/Time   NA 140 05/22/2020 1537   K 4.0 05/22/2020 1537   CL 102 05/22/2020 1537   CO2 25 05/22/2020 1537   GLUCOSE 100 (H) 05/22/2020 1537   GLUCOSE 83 10/08/2009 0516   BUN 11 05/22/2020 1537   CREATININE 0.74 05/22/2020 1537    CALCIUM 9.8 05/22/2020 1537   GFRNONAA 105 05/22/2020 1537   GFRAA 121 05/22/2020 1537    B   No results found.  Assessment/Plan: Missed ABortion Blood type Rh Pos.  For suction D& C Friday 05/24/2020 Procedure reviewed with patient .  [name] [name] 05/23/2020, 5:27 PM

## 2020-05-23 NOTE — H&P (View-Only) (Signed)
[name] [name] is an 34 y.o. female. She is a G1P0000 at [redacted]w[redacted]d By LMP with recent u/s indicating missed Ab with fetal pole at 9-10 weeks CRL and ABSENT Fetal heart activity, She had FHR noted at 6 wk dating u/s, no problems with bleeding or overall health. ] She was scheduled for NT testing and missed ab was diagnosed., confirmed by me by u/s review.  Pertinent Gynecological History: Menses: flow is moderate Bleeding:  Contraception:  DES exposure:  Blood transfusions: none Sexually transmitted diseases: no past history Previous GYN Procedures:   Last mammogram: normal Date:  Last pap: undated Date:  OB History: G1, P0   Menstrual History: Menarche age:  Patient's last menstrual period was 02/23/2020 (exact date).    Past Medical History:  Diagnosis Date  . ASD (atrial septal defect)   . Essential hypertension, benign   . Mitral valve prolapse     Past Surgical History:  Procedure Laterality Date  . ASD REPAIR  2002   [facility]   . CARDIAC SURGERY    . CHOLECYSTECTOMY  2011   [facility] - Dr. Lovell Sheehan     Family History  Problem Relation Age of Onset  . Pneumonia Paternal Grandfather   . Colon cancer Paternal Grandfather   . Cancer Paternal Grandmother        skin  . Blindness Paternal Grandmother   . Other Maternal Grandmother        vertigo  . Hypertension Maternal Grandmother   . Rheum arthritis Maternal Grandmother   . Anxiety disorder Maternal Grandmother   . Colon cancer Maternal Grandfather   . Cancer Father        rectal and colon  . Post-traumatic stress disorder Father   . Anxiety disorder Father   . Stroke Mother   . Anxiety disorder Mother   . Cancer Sister        bladder  . Migraines Maternal Aunt   . Colon cancer Maternal Aunt   . Cervical cancer Maternal Aunt     Social History:  reports that she quit smoking about 8 weeks ago. Her smoking use included cigarettes. She has a 20.00 pack-year smoking history. She has never used smokeless  tobacco. She reports current alcohol use. She reports that she does not use drugs.  Allergies:  Allergies  Allergen Reactions  . Amoxicillin Hives    Childhood  . Other     Ivory soap breaks her out in a rash  . Penicillins Hives    Childhood    (Not in a hospital admission)   Review of Systems .vsr   Last menstrual period 02/23/2020. Physical Exam HENT:     Head: Normocephalic.  Cardiovascular:     Rate and Rhythm: Normal rate and regular rhythm.  Pulmonary:     Effort: Pulmonary effort is normal.  Abdominal:     General: Abdomen is flat.  Skin:    Comments: Normal gait, activity    TECHNICIAN COMMENTS:  [place] 9+5 wks,single IUP,no fetal heart tones,crl 29.86 mm,normal ovaries,mult fibroids n/c,(#1) fundal 3.3 x 2.4 x 3.2,(#2) anterior 3.1 x 2.4 x 2.8 cm,(#3) posterior 2.6 x 2.7 x 1.7 cm,[name] [name] discussed results with pt.        A copy of this report including all images has been saved and backed up to a second source for retrieval if needed. All measures and details of the anatomical scan, placentation, fluid volume and pelvic anatomy are contained in that report.  [name] [name]  05/22/2020 2:52 PM   CBC    Component Value Date/Time   WBC 12.2 (H) 05/22/2020 1537   WBC 8.1 10/08/2009 0516   RBC 4.95 05/22/2020 1537   RBC 4.26 10/08/2009 0516   HGB 14.3 05/22/2020 1537   HCT 42.5 05/22/2020 1537   PLT 196 05/22/2020 1537   MCV 86 05/22/2020 1537   MCH 28.9 05/22/2020 1537   MCHC 33.6 05/22/2020 1537   MCHC 34.8 10/08/2009 0516   RDW 12.2 05/22/2020 1537   LYMPHSABS 1.9 10/08/2009 0516   MONOABS 0.7 10/08/2009 0516   EOSABS 0.2 10/08/2009 0516   BASOSABS 0.0 10/08/2009 0516   BMET    Component Value Date/Time   NA 140 05/22/2020 1537   K 4.0 05/22/2020 1537   CL 102 05/22/2020 1537   CO2 25 05/22/2020 1537   GLUCOSE 100 (H) 05/22/2020 1537   GLUCOSE 83 10/08/2009 0516   BUN 11 05/22/2020 1537   CREATININE 0.74 05/22/2020 1537    CALCIUM 9.8 05/22/2020 1537   GFRNONAA 105 05/22/2020 1537   GFRAA 121 05/22/2020 1537    B   No results found.  Assessment/Plan: Missed ABortion Blood type Rh Pos.  For suction D& C Friday 05/24/2020 Procedure reviewed with patient .  [name] [name] [name] [name] 05/23/2020, 5:27 PM 

## 2020-05-24 ENCOUNTER — Ambulatory Visit (HOSPITAL_COMMUNITY)
Admission: RE | Admit: 2020-05-24 | Discharge: 2020-05-24 | Disposition: A | Discharge status: Home / Self Care | Payer: BC Managed Care – PPO | Attending: Obstetrics and Gynecology | Admitting: Obstetrics and Gynecology

## 2020-05-24 ENCOUNTER — Ambulatory Visit (HOSPITAL_COMMUNITY): Payer: BC Managed Care – PPO | Admitting: Anesthesiology

## 2020-05-24 ENCOUNTER — Encounter (HOSPITAL_COMMUNITY)
Admission: RE | Disposition: A | Discharge status: Home / Self Care | Payer: Self-pay | Source: Home / Self Care | Attending: Obstetrics and Gynecology

## 2020-05-24 ENCOUNTER — Encounter (HOSPITAL_COMMUNITY): Payer: Self-pay | Admitting: Obstetrics and Gynecology

## 2020-05-24 DIAGNOSIS — Z880 Allergy status to penicillin: Secondary | ICD-10-CM | POA: Insufficient documentation

## 2020-05-24 DIAGNOSIS — Z823 Family history of stroke: Secondary | ICD-10-CM | POA: Insufficient documentation

## 2020-05-24 DIAGNOSIS — Z820 Family history of epilepsy and other diseases of the nervous system: Secondary | ICD-10-CM | POA: Insufficient documentation

## 2020-05-24 DIAGNOSIS — Z8261 Family history of arthritis: Secondary | ICD-10-CM | POA: Insufficient documentation

## 2020-05-24 DIAGNOSIS — Z9049 Acquired absence of other specified parts of digestive tract: Secondary | ICD-10-CM | POA: Diagnosis not present

## 2020-05-24 DIAGNOSIS — Z818 Family history of other mental and behavioral disorders: Secondary | ICD-10-CM | POA: Diagnosis not present

## 2020-05-24 DIAGNOSIS — Z3A12 12 weeks gestation of pregnancy: Secondary | ICD-10-CM | POA: Diagnosis not present

## 2020-05-24 DIAGNOSIS — Z91048 Other nonmedicinal substance allergy status: Secondary | ICD-10-CM | POA: Diagnosis not present

## 2020-05-24 DIAGNOSIS — I341 Nonrheumatic mitral (valve) prolapse: Secondary | ICD-10-CM | POA: Insufficient documentation

## 2020-05-24 DIAGNOSIS — I10 Essential (primary) hypertension: Secondary | ICD-10-CM | POA: Insufficient documentation

## 2020-05-24 DIAGNOSIS — Z87891 Personal history of nicotine dependence: Secondary | ICD-10-CM | POA: Diagnosis not present

## 2020-05-24 DIAGNOSIS — Z800 Family history of malignant neoplasm of digestive organs: Secondary | ICD-10-CM | POA: Insufficient documentation

## 2020-05-24 DIAGNOSIS — Z8052 Family history of malignant neoplasm of bladder: Secondary | ICD-10-CM | POA: Diagnosis not present

## 2020-05-24 DIAGNOSIS — O021 Missed abortion: Secondary | ICD-10-CM

## 2020-05-24 DIAGNOSIS — Q211 Atrial septal defect: Secondary | ICD-10-CM | POA: Diagnosis not present

## 2020-05-24 DIAGNOSIS — Z8249 Family history of ischemic heart disease and other diseases of the circulatory system: Secondary | ICD-10-CM | POA: Diagnosis not present

## 2020-05-24 DIAGNOSIS — Z808 Family history of malignant neoplasm of other organs or systems: Secondary | ICD-10-CM | POA: Insufficient documentation

## 2020-05-24 DIAGNOSIS — Z8049 Family history of malignant neoplasm of other genital organs: Secondary | ICD-10-CM | POA: Diagnosis not present

## 2020-05-24 HISTORY — PX: DILATION AND CURETTAGE OF UTERUS: SHX78

## 2020-05-24 SURGERY — DILATION AND CURETTAGE
Anesthesia: General | Site: Vagina

## 2020-05-24 MED ORDER — KETOROLAC TROMETHAMINE 30 MG/ML IJ SOLN
INTRAMUSCULAR | Status: AC
  Filled 2020-05-24: qty 1

## 2020-05-24 MED ORDER — METHYLERGONOVINE MALEATE 0.2 MG/ML IJ SOLN
INTRAMUSCULAR | Status: AC
  Filled 2020-05-24: qty 1

## 2020-05-24 MED ORDER — KETOROLAC TROMETHAMINE 30 MG/ML IJ SOLN
30.0000 mg | Freq: Once | INTRAMUSCULAR | Status: AC
  Administered 2020-05-24: 30 mg via INTRAVENOUS

## 2020-05-24 MED ORDER — KETOROLAC TROMETHAMINE 30 MG/ML IJ SOLN: 30.0000 mg | Freq: Once | INTRAMUSCULAR | Status: AC

## 2020-05-24 MED ORDER — MIDAZOLAM HCL 2 MG/2ML IJ SOLN
INTRAMUSCULAR | Status: AC
  Filled 2020-05-24: qty 2

## 2020-05-24 MED ORDER — LIDOCAINE HCL (CARDIAC) PF 100 MG/5ML IV SOSY
PREFILLED_SYRINGE | INTRAVENOUS | Status: DC | PRN
  Administered 2020-05-24: 60 mg via INTRAVENOUS

## 2020-05-24 MED ORDER — FENTANYL CITRATE (PF) 100 MCG/2ML IJ SOLN
INTRAMUSCULAR | Status: AC
  Filled 2020-05-24: qty 2

## 2020-05-24 MED ORDER — PROPOFOL 10 MG/ML IV BOLUS
INTRAVENOUS | Status: AC
  Filled 2020-05-24: qty 20

## 2020-05-24 MED ORDER — IBUPROFEN 600 MG PO TABS: 600.0000 mg | ORAL_TABLET | Freq: Four times a day (QID) | ORAL | 1 refills | Status: DC | PRN

## 2020-05-24 MED ORDER — MIDAZOLAM HCL 5 MG/5ML IJ SOLN
INTRAMUSCULAR | Status: DC | PRN
  Administered 2020-05-24: 2 mg via INTRAVENOUS

## 2020-05-24 MED ORDER — CHLORHEXIDINE GLUCONATE 0.12 % MT SOLN
15.0000 mL | Freq: Once | OROMUCOSAL | Status: AC
  Administered 2020-05-24: 15 mL via OROMUCOSAL

## 2020-05-24 MED ORDER — FENTANYL CITRATE (PF) 100 MCG/2ML IJ SOLN
INTRAMUSCULAR | Status: DC | PRN
Start: 2020-05-24 — End: 2020-05-24
  Administered 2020-05-24: 25 ug via INTRAVENOUS
  Administered 2020-05-24: 50 ug via INTRAVENOUS
  Administered 2020-05-24: 25 ug via INTRAVENOUS

## 2020-05-24 MED ORDER — FENTANYL CITRATE (PF) 100 MCG/2ML IJ SOLN: 25.0000 ug | INTRAMUSCULAR | Status: DC | PRN

## 2020-05-24 MED ORDER — OXYTOCIN 10 UNIT/ML IJ SOLN
INTRAMUSCULAR | Status: AC
  Filled 2020-05-24: qty 1

## 2020-05-24 MED ORDER — PROPOFOL 10 MG/ML IV BOLUS
INTRAVENOUS | Status: DC | PRN
  Administered 2020-05-24: 160 mg via INTRAVENOUS

## 2020-05-24 MED ORDER — LIDOCAINE 2% (20 MG/ML) 5 ML SYRINGE
INTRAMUSCULAR | Status: AC
  Filled 2020-05-24: qty 5

## 2020-05-24 MED ORDER — LACTATED RINGERS IV SOLN
INTRAVENOUS | Status: DC
  Administered 2020-05-24: 1000 mL via INTRAVENOUS

## 2020-05-24 MED ORDER — 0.9 % SODIUM CHLORIDE (POUR BTL) OPTIME
TOPICAL | Status: DC | PRN
  Administered 2020-05-24: 1000 mL

## 2020-05-24 MED ORDER — ONDANSETRON HCL 4 MG/2ML IJ SOLN
4.0000 mg | Freq: Once | INTRAMUSCULAR | Status: AC | PRN
  Administered 2020-05-24: 4 mg via INTRAVENOUS
  Filled 2020-05-24: qty 2

## 2020-05-24 MED ORDER — ORAL CARE MOUTH RINSE: 15.0000 mL | Freq: Once | OROMUCOSAL | Status: AC

## 2020-05-24 SURGICAL SUPPLY — 31 items
CATH ROBINSON RED A/P 14FR (CATHETERS) IMPLANT
CLOTH BEACON ORANGE TIMEOUT ST (SAFETY) ×2 IMPLANT
COVER LIGHT HANDLE STERIS (MISCELLANEOUS) ×4 IMPLANT
COVER MAYO STAND XLG (MISCELLANEOUS) ×2 IMPLANT
COVER WAND RF STERILE (DRAPES) ×2 IMPLANT
CURETTE VACUUM 9MM CVD CLR (CANNULA) ×2 IMPLANT
DECANTER SPIKE VIAL GLASS SM (MISCELLANEOUS) IMPLANT
GAUZE 4X4 16PLY RFD (DISPOSABLE) ×4 IMPLANT
GLOVE BIOGEL PI IND STRL 7.0 (GLOVE) ×2 IMPLANT
GLOVE BIOGEL PI IND STRL 9 (GLOVE) ×1 IMPLANT
GLOVE BIOGEL PI INDICATOR 7.0 (GLOVE) ×2
GLOVE BIOGEL PI INDICATOR 9 (GLOVE) ×1
GLOVE ECLIPSE 9.0 STRL (GLOVE) ×2 IMPLANT
GOWN SPEC L3 XXLG W/TWL (GOWN DISPOSABLE) ×2 IMPLANT
GOWN STRL REUS W/TWL LRG LVL3 (GOWN DISPOSABLE) ×2 IMPLANT
KIT BERKELEY 1ST TRIMESTER 3/8 (MISCELLANEOUS) ×2 IMPLANT
KIT TURNOVER CYSTO (KITS) ×2 IMPLANT
MARKER SKIN DUAL TIP RULER LAB (MISCELLANEOUS) ×2 IMPLANT
NEEDLE SPNL 22GX3.5 QUINCKE BK (NEEDLE) IMPLANT
NS IRRIG 1000ML POUR BTL (IV SOLUTION) ×2 IMPLANT
PACK BASIC III (CUSTOM PROCEDURE TRAY) ×2
PACK SRG BSC III STRL LF ECLPS (CUSTOM PROCEDURE TRAY) ×1 IMPLANT
PAD ARMBOARD 7.5X6 YLW CONV (MISCELLANEOUS) ×2 IMPLANT
SET BASIN LINEN APH (SET/KITS/TRAYS/PACK) ×2 IMPLANT
SET BERKELEY SUCTION TUBING (SUCTIONS) ×2 IMPLANT
SHEET LAVH (DRAPES) ×2 IMPLANT
SYR CONTROL 10ML LL (SYRINGE) IMPLANT
TOWEL OR 17X26 4PK STRL BLUE (TOWEL DISPOSABLE) ×2 IMPLANT
VACURETTE 10MM (CANNULA) IMPLANT
VACURETTE 12MM (CANNULA) IMPLANT
VACURETTE 8MM (CANNULA) IMPLANT

## 2020-05-24 NOTE — Anesthesia Preprocedure Evaluation (Signed)
Anesthesia Evaluation  Patient identified by MRN, date of birth, ID band Patient awake    Reviewed: Allergy & Precautions, H&P , NPO status , Patient's Chart, lab work & pertinent test results, reviewed documented beta blocker date and time   Airway Mallampati: II  TM Distance: >3 FB Neck ROM: full    Dental no notable dental hx.    Pulmonary neg pulmonary ROS, former smoker,    Pulmonary exam normal breath sounds clear to auscultation       Cardiovascular Exercise Tolerance: Good hypertension, + Valvular Problems/Murmurs MVP  Rhythm:regular Rate:Normal     Neuro/Psych negative neurological ROS  negative psych ROS   GI/Hepatic negative GI ROS, Neg liver ROS,   Endo/Other  negative endocrine ROS  Renal/GU negative Renal ROS  negative genitourinary   Musculoskeletal   Abdominal   Peds  Hematology negative hematology ROS (+)   Anesthesia Other Findings   Reproductive/Obstetrics negative OB ROS                             Anesthesia Physical Anesthesia Plan  ASA: II  Anesthesia Plan: General   Post-op Pain Management:    Induction:   PONV Risk Score and Plan: Ondansetron  Airway Management Planned:   Additional Equipment:   Intra-op Plan:   Post-operative Plan:   Informed Consent: I have reviewed the patients History and Physical, chart, labs and discussed the procedure including the risks, benefits and alternatives for the proposed anesthesia with the patient or authorized representative who has indicated his/her understanding and acceptance.     Dental Advisory Given  Plan Discussed with: CRNA  Anesthesia Plan Comments:         Anesthesia Quick Evaluation

## 2020-05-24 NOTE — Brief Op Note (Addendum)
05/24/2020  2:43 PM  PATIENT:  [name] [name]  34 y.o. female  PRE-OPERATIVE DIAGNOSIS:  missed abortion   POST-OPERATIVE DIAGNOSIS:  missed abortion  PROCEDURE:  Procedure(s): SUCTION DILATATION AND CURETTAGE (N/A)  SURGEON:  Surgeon(s) and Role:    [name], MD - Primary  PHYSICIAN ASSISTANT:   ASSISTANTS: None  ANESTHESIA:   general  EBL:  200 mL   BLOOD ADMINISTERED:none  DRAINS: none   LOCAL MEDICATIONS USED:  NONE  SPECIMEN:  Source of Specimen:  roducts of conception.  DISPOSITION OF SPECIMEN:  PATHOLOGY  COUNTS:  YES  TOURNIQUET:  * No tourniquets in log *  DICTATION: .Dragon Dictation  PLAN OF CARE: Discharge to home after PACU  PATIENT DISPOSITION:  PACU - hemodynamically stable.   Delay start of Pharmacological VTE agent (>24hrs) due to surgical blood loss or risk of bleeding: not applicable Details of procedure: Patient was taken to the operating room prepped and draped for vaginal procedure.  Timeout was conducted.  After confirmation of the surgical team, speculum was inserted, cervix grasped with single-tooth tenaculum, the uterus sounded in the anteflexed position to 12 cm with dilation of the cervix to 25 [place] allowing introduction of a 9 mm curved suction curette which was then placed under 45 mm suction pressure and used circumferentially obtaining amniotic fluid, fetal fragments and placental tissue.  Randall stone forceps were used to grasp a large portion of the placenta and membrane and extracted in 1 piece.  Smooth sharp curettage was again performed in circumferential fashion until no additional tissues were obtained and then sharp curettage briefly used to confirm a uniform gritty feel.  There was no suspicion of complications and blood loss was minimal.  Condition to recovery room good blood type Rh+.

## 2020-05-24 NOTE — Interval H&P Note (Signed)
History and Physical Interval Note:  05/24/2020 1:27 PM  [name] [name]  has presented today for surgery, with the diagnosis of missed abortion.  The various methods of treatment have been discussed with the patient and family. After consideration of risks, benefits and other options for treatment, the patient has consented to  Procedure(s): SUCTION DILATATION AND CURETTAGE (N/A) as a surgical intervention.  The patient's history has been reviewed, patient examined, no change in status, stable for surgery.  I have reviewed the patient's chart and labs.  Questions were answered to the patient's satisfaction.  We have discussed chromosomal testing, and she has chosen not to proceed with testing at this time.   [name]

## 2020-05-24 NOTE — Anesthesia Procedure Notes (Signed)
Procedure Name: LMA Insertion Date/Time: 05/24/2020 1:36 PM Performed by: Moshe Salisbury, CRNA Pre-anesthesia Checklist: Patient identified, Patient being monitored, Emergency Drugs available, Timeout performed and Suction available Patient Re-evaluated:Patient Re-evaluated prior to induction Oxygen Delivery Method: Circle System Utilized Preoxygenation: Pre-oxygenation with 100% oxygen Induction Type: IV induction Ventilation: Mask ventilation without difficulty LMA: LMA inserted LMA Size: 4.0 Number of attempts: 1 Placement Confirmation: positive ETCO2 and breath sounds checked- equal and bilateral

## 2020-05-24 NOTE — Transfer of Care (Signed)
Immediate Anesthesia Transfer of Care Note  Patient: [name] [name]  Procedure(s) Performed: SUCTION DILATATION AND CURETTAGE (N/A Vagina )  Patient Location: PACU  Anesthesia Type:General  Level of Consciousness: awake  Airway & Oxygen Therapy: Patient Spontanous Breathing  Post-op Assessment: Report given to RN  Post vital signs: Reviewed  Last Vitals:  Vitals Value Taken Time  BP 145/92 05/24/20 1415  Temp    Pulse 70 05/24/20 1420  Resp 17 05/24/20 1420  SpO2 97 % 05/24/20 1420  Vitals shown include unvalidated device data.  Last Pain:  Vitals:   05/24/20 1228  TempSrc: Oral  PainSc: 0-No pain      Patients Stated Pain Goal: 5 (05/24/20 1228)  Complications: No complications documented.

## 2020-05-24 NOTE — Op Note (Signed)
05/24/2020  2:43 PM  PATIENT:  [name] [name]  34 y.o. female  PRE-OPERATIVE DIAGNOSIS:  missed abortion   POST-OPERATIVE DIAGNOSIS:  missed abortion  PROCEDURE:  Procedure(s): SUCTION DILATATION AND CURETTAGE (N/A)  SURGEON:  Surgeon(s) and Role:    * Dorine Duffey V, MD - Primary  PHYSICIAN ASSISTANT:   ASSISTANTS: None  ANESTHESIA:   general  EBL:  200 mL   BLOOD ADMINISTERED:none  DRAINS: none   LOCAL MEDICATIONS USED:  NONE  SPECIMEN:  Source of Specimen:  roducts of conception.  DISPOSITION OF SPECIMEN:  PATHOLOGY  COUNTS:  YES  TOURNIQUET:  * No tourniquets in log *  DICTATION: .Dragon Dictation  PLAN OF CARE: Discharge to home after PACU  PATIENT DISPOSITION:  PACU - hemodynamically stable.   Delay start of Pharmacological VTE agent (>24hrs) due to surgical blood loss or risk of bleeding: not applicable Details of procedure: Patient was taken to the operating room prepped and draped for vaginal procedure.  Timeout was conducted.  After confirmation of the surgical team, speculum was inserted, cervix grasped with single-tooth tenaculum, the uterus sounded in the anteflexed position to 12 cm with dilation of the cervix to 29 [place] allowing introduction of a 9 mm curved suction curette which was then placed under 45 mm suction pressure and used circumferentially obtaining amniotic fluid, fetal fragments and placental tissue.  Randall stone forceps were used to grasp a large portion of the placenta and membrane and extracted in 1 piece.  Smooth sharp curettage was again performed in circumferential fashion until no additional tissues were obtained and then sharp curettage briefly used to confirm a uniform gritty feel.  There was no suspicion of complications and blood loss was minimal.  Condition to recovery room good blood type Rh+. 

## 2020-05-24 NOTE — Discharge Instructions (Signed)
Dilation and Curettage or Vacuum Curettage, Care After These instructions give you information about caring for yourself after your procedure. Your doctor may also give you more specific instructions. Call your doctor if you have any problems or questions after your procedure. Follow these instructions at home: Activity  Do not drive or use heavy machinery while taking prescription pain medicine.  For 24 hours after your procedure, avoid driving.  Take short walks often, followed by rest periods. Ask your doctor what activities are safe for you. After one or two days, you may be able to return to your normal activities.  Do not lift anything that is heavier than 10 lb (4.5 kg) until your doctor approves.  For at least 2 weeks, or as long as told by your doctor: ? Do not douche. ? Do not use tampons. ? Do not have sex. General instructions   Take over-the-counter and prescription medicines only as told by your doctor. This is very important if you take blood thinning medicine.  Do not take baths, swim, or use a hot tub until your doctor approves. Take showers instead of baths.  Wear compression stockings as told by your doctor.  It is up to you to get the results of your procedure. Ask your doctor when your results will be ready.  Keep all follow-up visits as told by your doctor. This is important. Contact a doctor if:  You have very bad cramps that get worse or do not get better with medicine.  You have very bad pain in your belly (abdomen).  You cannot drink fluids without throwing up (vomiting).  You get pain in a different part of the area between your belly and thighs (pelvis).  You have bad-smelling discharge from your vagina.  You have a rash. Get help right away if:  You are bleeding a lot from your vagina. A lot of bleeding means soaking more than one sanitary pad in an hour, for 2 hours in a row.  You have clumps of blood (blood clots) coming from your  vagina.  You have a fever or chills.  Your belly feels very tender or hard.  You have chest pain.  You have trouble breathing.  You cough up blood.  You feel dizzy.  You feel light-headed.  You pass out (faint).  You have pain in your neck or shoulder area. Summary  Take short walks often, followed by rest periods. Ask your doctor what activities are safe for you. After one or two days, you may be able to return to your normal activities.  Do not lift anything that is heavier than 10 lb (4.5 kg) until your doctor approves.  Do not take baths, swim, or use a hot tub until your doctor approves. Take showers instead of baths.  Contact your doctor if you have any symptoms of infection, like bad-smelling discharge from your vagina. This information is not intended to replace advice given to you by your health care provider. Make sure you discuss any questions you have with your health care provider. Document Revised: 08/13/2017 Document Reviewed: 05/18/2016 Elsevier Patient Education  2020 Elsevier Inc.  General Anesthesia, Adult, Care After This sheet gives you information about how to care for yourself after your procedure. Your health care provider may also give you more specific instructions. If you have problems or questions, contact your health care provider. What can I expect after the procedure? After the procedure, the following side effects are common:  Pain or discomfort at the   IV site.  Nausea.  Vomiting.  Sore throat.  Trouble concentrating.  Feeling cold or chills.  Weak or tired.  Sleepiness and fatigue.  Soreness and body aches. These side effects can affect parts of the body that were not involved in surgery. Follow these instructions at home:  For at least 24 hours after the procedure:  Have a responsible adult stay with you. It is important to have someone help care for you until you are awake and alert.  Rest as needed.  Do  not: ? Participate in activities in which you could fall or become injured. ? Drive. ? Use heavy machinery. ? Drink alcohol. ? Take sleeping pills or medicines that cause drowsiness. ? Make important decisions or sign legal documents. ? Take care of children on your own. Eating and drinking  Follow any instructions from your health care provider about eating or drinking restrictions.  When you feel hungry, start by eating small amounts of foods that are soft and easy to digest (bland), such as toast. Gradually return to your regular diet.  Drink enough fluid to keep your urine pale yellow.  If you vomit, rehydrate by drinking water, juice, or clear broth. General instructions  If you have sleep apnea, surgery and certain medicines can increase your risk for breathing problems. Follow instructions from your health care provider about wearing your sleep device: ? Anytime you are sleeping, including during daytime naps. ? While taking prescription pain medicines, sleeping medicines, or medicines that make you drowsy.  Return to your normal activities as told by your health care provider. Ask your health care provider what activities are safe for you.  Take over-the-counter and prescription medicines only as told by your health care provider.  If you smoke, do not smoke without supervision.  Keep all follow-up visits as told by your health care provider. This is important. Contact a health care provider if:  You have nausea or vomiting that does not get better with medicine.  You cannot eat or drink without vomiting.  You have pain that does not get better with medicine.  You are unable to pass urine.  You develop a skin rash.  You have a fever.  You have redness around your IV site that gets worse. Get help right away if:  You have difficulty breathing.  You have chest pain.  You have blood in your urine or stool, or you vomit blood. Summary  After the procedure, it  is common to have a sore throat or nausea. It is also common to feel tired.  Have a responsible adult stay with you for the first 24 hours after general anesthesia. It is important to have someone help care for you until you are awake and alert.  When you feel hungry, start by eating small amounts of foods that are soft and easy to digest (bland), such as toast. Gradually return to your regular diet.  Drink enough fluid to keep your urine pale yellow.  Return to your normal activities as told by your health care provider. Ask your health care provider what activities are safe for you. This information is not intended to replace advice given to you by your health care provider. Make sure you discuss any questions you have with your health care provider. Document Revised: 09/03/2017 Document Reviewed: 04/16/2017 Elsevier Patient Education  2020 Elsevier Inc.  

## 2020-05-24 NOTE — Anesthesia Postprocedure Evaluation (Signed)
Anesthesia Post Note  Patient: [name] [name]  Procedure(s) Performed: SUCTION DILATATION AND CURETTAGE (N/A Vagina )  Patient location during evaluation: PACU Anesthesia Type: General Level of consciousness: awake and alert and oriented Pain management: pain level controlled Vital Signs Assessment: post-procedure vital signs reviewed and stable Respiratory status: spontaneous breathing Cardiovascular status: stable Postop Assessment: no apparent nausea or vomiting Anesthetic complications: no   No complications documented.   Last Vitals:  Vitals:   05/24/20 1228 05/24/20 1415  BP: 136/83 (!) 145/92  Pulse: 79 73  Resp: 18 (!) 23  Temp: 36.8 C (P) 36.9 C  SpO2: 99% 97%    Last Pain:  Vitals:   05/24/20 1228  TempSrc: Oral  PainSc: 0-No pain                 Summers Buendia

## 2020-05-27 ENCOUNTER — Encounter (HOSPITAL_COMMUNITY): Payer: Self-pay | Admitting: Obstetrics and Gynecology

## 2020-05-28 LAB — SURGICAL PATHOLOGY

## 2020-06-05 ENCOUNTER — Ambulatory Visit (INDEPENDENT_AMBULATORY_CARE_PROVIDER_SITE_OTHER): Payer: BC Managed Care – PPO | Admitting: Obstetrics and Gynecology

## 2020-06-05 ENCOUNTER — Encounter: Payer: Self-pay | Admitting: Obstetrics and Gynecology

## 2020-06-05 VITALS — BP 179/117 | HR 77 | Ht 64.0 in | Wt 200.0 lb

## 2020-06-05 DIAGNOSIS — Z09 Encounter for follow-up examination after completed treatment for conditions other than malignant neoplasm: Secondary | ICD-10-CM

## 2020-06-05 NOTE — Patient Instructions (Signed)
Continue prenatal vitamins while trying to conceive.

## 2020-06-05 NOTE — Progress Notes (Signed)
Patient ID: [name] [name], female   DOB: 1986-03-06, 34 y.o.   MRN: [id number]  Subjective:  [name] [name] is a 34 y.o. female now 2 weeks status post suction dilatation and curettage. She states that yesterday, she had minimal bleeding. Last night, there was a lot of bright red blood. Today, the bleeding is minimal and dark brown. She passed a clot on the first day she starting bleeding. She has some abdominal cramping (3/10). Denies fever.   Review of Systems Negative except as noted above.   Diet: normal   Bowel movements: normal.  Pain is controlled without medication.  Objective:  BP (!) 179/117 (BP Location: Left Arm, Patient Position: Sitting, Cuff Size: Normal)   Pulse 77   Ht 5\' 4"  (1.626 m)   Wt 200 lb (90.7 kg)   LMP 02/23/2020 (Exact Date)   BMI 34.33 kg/m  General:Well developed, well nourished.  No acute distress. Abdomen: Bowel sounds normal, soft, non-tender. Pelvic Exam: DEFERRED  Incision(s): Exam deferred pt reports are reassuring.  Assessment:  Post-Op 2 weeks s/p suction dilatation and curettage.  Doing well postoperatively.   Plan:  1. Wound care discussed. 2.   Current medications: unchanged 3.   Activity restrictions: no sexual activity until bleeding stops.  4.   Return to work: now. 5.   Follow up in prn  By signing my name below, I, 04/24/2020, attest that this documentation has been prepared under the direction and in the presence of Pietro Cassis, MD. Electronically Signed: [name], Medical Scribe. 06/05/20. 3:51 PM.  I personally performed the services described in this documentation, which was SCRIBED in my presence. The recorded information has been reviewed and considered accurate. It has been edited as necessary during review. 06/07/20, MD

## 2020-07-02 ENCOUNTER — Telehealth: Payer: Self-pay | Admitting: Adult Health

## 2020-07-02 NOTE — Telephone Encounter (Signed)
Pt had a D&C on 05/24/20. Started first period on Thursday. Period was heavy on Friday, passing clots. Bleeding got better on Friday evening. Saturday not bad. Sunday, bleeding was almost gone. Bleeding heavier today. I spoke with Selena Batten B. It's not unusual to have heavier bleeding that first period after a D&C. Pt voiced understanding and was advised to let us know if anything changes. JSY

## 2020-07-02 NOTE — Telephone Encounter (Signed)
Left message @ 4:43 pm. JSY 

## 2020-07-02 NOTE — Telephone Encounter (Signed)
Pt called, concerned with heavy bleeding & clots States she had D&C 05/24/2020, this is her 1st cycle since Heavy bleeding & clots 06/28/2020, called after hours nurse line, OK Sat-Mon, then today another bout of clots & heavy bleeding  Please advise & call pt

## 2020-07-22 DIAGNOSIS — Z20822 Contact with and (suspected) exposure to covid-19: Secondary | ICD-10-CM | POA: Diagnosis not present

## 2020-08-06 ENCOUNTER — Encounter: Payer: Self-pay | Admitting: *Deleted

## 2020-09-24 DIAGNOSIS — N912 Amenorrhea, unspecified: Secondary | ICD-10-CM | POA: Diagnosis not present

## 2020-09-26 DIAGNOSIS — O26841 Uterine size-date discrepancy, first trimester: Secondary | ICD-10-CM | POA: Diagnosis not present

## 2020-10-05 ENCOUNTER — Emergency Department (HOSPITAL_COMMUNITY): Payer: BC Managed Care – PPO

## 2020-10-05 ENCOUNTER — Other Ambulatory Visit: Payer: Self-pay

## 2020-10-05 ENCOUNTER — Emergency Department (HOSPITAL_COMMUNITY)
Admission: EM | Admit: 2020-10-05 | Discharge: 2020-10-06 | Disposition: A | Discharge status: Home / Self Care | Payer: BC Managed Care – PPO | Attending: Emergency Medicine | Admitting: Emergency Medicine

## 2020-10-05 ENCOUNTER — Encounter (HOSPITAL_COMMUNITY): Payer: Self-pay | Admitting: *Deleted

## 2020-10-05 DIAGNOSIS — Z87891 Personal history of nicotine dependence: Secondary | ICD-10-CM | POA: Insufficient documentation

## 2020-10-05 DIAGNOSIS — I10 Essential (primary) hypertension: Secondary | ICD-10-CM | POA: Insufficient documentation

## 2020-10-05 DIAGNOSIS — O200 Threatened abortion: Secondary | ICD-10-CM

## 2020-10-05 DIAGNOSIS — O209 Hemorrhage in early pregnancy, unspecified: Secondary | ICD-10-CM | POA: Diagnosis not present

## 2020-10-05 DIAGNOSIS — O4691 Antepartum hemorrhage, unspecified, first trimester: Secondary | ICD-10-CM | POA: Insufficient documentation

## 2020-10-05 DIAGNOSIS — O418X10 Other specified disorders of amniotic fluid and membranes, first trimester, not applicable or unspecified: Secondary | ICD-10-CM

## 2020-10-05 DIAGNOSIS — Z3A01 Less than 8 weeks gestation of pregnancy: Secondary | ICD-10-CM | POA: Diagnosis not present

## 2020-10-05 DIAGNOSIS — O468X1 Other antepartum hemorrhage, first trimester: Secondary | ICD-10-CM

## 2020-10-05 LAB — CBC WITH DIFFERENTIAL/PLATELET
Abs Immature Granulocytes: 0.03 10*3/uL (ref 0.00–0.07)
Basophils Absolute: 0.1 10*3/uL (ref 0.0–0.1)
Basophils Relative: 0 %
Eosinophils Absolute: 0.3 10*3/uL (ref 0.0–0.5)
Eosinophils Relative: 2 %
HCT: 44.3 % (ref 36.0–46.0)
Hemoglobin: 14.7 g/dL (ref 12.0–15.0)
Immature Granulocytes: 0 %
Lymphocytes Relative: 26 %
Lymphs Abs: 3.8 10*3/uL (ref 0.7–4.0)
MCH: 29.3 pg (ref 26.0–34.0)
MCHC: 33.2 g/dL (ref 30.0–36.0)
MCV: 88.2 fL (ref 80.0–100.0)
Monocytes Absolute: 1.2 10*3/uL — ABNORMAL HIGH (ref 0.1–1.0)
Monocytes Relative: 8 %
Neutro Abs: 9.1 10*3/uL — ABNORMAL HIGH (ref 1.7–7.7)
Neutrophils Relative %: 64 %
Platelets: 180 10*3/uL (ref 150–400)
RBC: 5.02 MIL/uL (ref 3.87–5.11)
RDW: 12 % (ref 11.5–15.5)
WBC: 14.5 10*3/uL — ABNORMAL HIGH (ref 4.0–10.5)
nRBC: 0 % (ref 0.0–0.2)

## 2020-10-05 LAB — HCG, QUANTITATIVE, PREGNANCY: hCG, Beta Chain, Quant, S: 106876 m[IU]/mL — ABNORMAL HIGH (ref <5)

## 2020-10-05 NOTE — ED Notes (Addendum)
Pt transported to [facility]

## 2020-10-05 NOTE — ED Provider Notes (Signed)
[facility] EMERGENCY DEPARTMENT Provider Note   CSN: [id number] Arrival date & time: 10/05/20  2038     History Chief Complaint  Patient presents with  . Vaginal Bleeding    [name] [name] is a 35 y.o. female.  Patient is a 35 year old female G2 P0-0-1-0 at estimated 6 to [redacted] weeks gestation.  She presents today for evaluation of vaginal bleeding.  Patient started this evening with vaginal bleeding she noted in her underwear, then in the toilet.  She denies the passage of tissue.  She reports some pressure and describes a "upset stomach".  She denies any injury or trauma.  Patient had her first pregnancy ended in miscarriage in September 2021.  The history is provided by the patient.  Vaginal Bleeding Quality:  Bright red Severity:  Mild Onset quality:  Sudden Timing:  Intermittent Progression:  Resolved Chronicity:  New Relieved by:  Nothing Worsened by:  Nothing Ineffective treatments:  None tried      Past Medical History:  Diagnosis Date  . ASD (atrial septal defect)   . Essential hypertension, benign   . Mitral valve prolapse     Patient Active Problem List   Diagnosis Date Noted  . TOBACCO ABUSE 10/15/2010  . Essential hypertension, benign 07/03/2009  . MITRAL VALVE PROLAPSE 07/03/2009  . ATRIAL SEPTAL DEFECT 07/03/2009    Past Surgical History:  Procedure Laterality Date  . ASD REPAIR  2002   [facility]   . CARDIAC SURGERY    . CHOLECYSTECTOMY  2011   [facility] - Dr. Lovell Sheehan   . DILATION AND CURETTAGE OF UTERUS N/A 05/24/2020   Procedure: SUCTION DILATATION AND CURETTAGE;  Surgeon: [name], MD;  Location: [facility] OR[facility];  Service: Gynecology;  Laterality: N/A;     OB History    Gravida  2   Para  0   Term  0   Preterm  0   AB  0   Living  0     SAB  0   IAB  0   Ectopic  0   Multiple  0   Live Births  0           Family History  Problem Relation Age of Onset  . Pneumonia Paternal Grandfather   . Colon cancer Paternal  Grandfather   . Cancer Paternal Grandmother        skin  . Blindness Paternal Grandmother   . Other Maternal Grandmother        vertigo  . Hypertension Maternal Grandmother   . Rheum arthritis Maternal Grandmother   . Anxiety disorder Maternal Grandmother   . Colon cancer Maternal Grandfather   . Cancer Father        rectal and colon  . Post-traumatic stress disorder Father   . Anxiety disorder Father   . Stroke Mother   . Anxiety disorder Mother   . Cancer Sister        bladder  . Migraines Maternal Aunt   . Colon cancer Maternal Aunt   . Cervical cancer Maternal Aunt     Social History   Tobacco Use  . Smoking status: Former Smoker    Packs/day: 1.00    Years: 20.00    Pack years: 20.00    Types: Cigarettes    Quit date: 03/26/2020    Years since quitting: 0.5  . Smokeless tobacco: Never Used  . Tobacco comment: pt advised she did eletric cig for 6 months until 05-15-13  Vaping Use  .  Vaping Use: Never used  Substance Use Topics  . Alcohol use: Yes    Comment: occasionally  . Drug use: No    Home Medications Prior to Admission medications   Medication Sig Start Date End Date Taking? Authorizing Provider  acetaminophen (TYLENOL) 500 MG tablet Take 500-1,000 mg by mouth every 6 (six) hours as needed for headache.    [provider]  ibuprofen (ADVIL) 600 MG tablet Take 1 tablet (600 mg total) by mouth every 6 (six) hours as needed. 05/24/20   [name], MD    Allergies    Amoxicillin, Other, and Penicillins  Review of Systems   Review of Systems  Genitourinary: Positive for vaginal bleeding.  All other systems reviewed and are negative.   Physical Exam Updated Vital Signs BP (!) 150/97 (BP Location: Right Arm)   Pulse 81   Temp 97.6 F (36.4 C) (Oral)   Resp 18   Ht 5\' 4"  (1.626 m)   Wt 96 kg   LMP 08/21/2020   SpO2 100%   BMI 36.32 kg/m   Physical Exam Vitals and nursing note reviewed.  Constitutional:      General: She is not  in acute distress.    Appearance: She is well-developed and well-nourished. She is not diaphoretic.  HENT:     Head: Normocephalic and atraumatic.  Cardiovascular:     Rate and Rhythm: Normal rate and regular rhythm.     Heart sounds: No murmur heard. No friction rub. No gallop.   Pulmonary:     Effort: Pulmonary effort is normal. No respiratory distress.     Breath sounds: Normal breath sounds. No wheezing.  Abdominal:     General: Bowel sounds are normal. There is no distension.     Palpations: Abdomen is soft.     Tenderness: There is no abdominal tenderness.  Musculoskeletal:        General: Normal range of motion.     Cervical back: Normal range of motion and neck supple.  Skin:    General: Skin is warm and dry.  Neurological:     Mental Status: She is alert and oriented to person, place, and time.     ED Results / Procedures / Treatments   Labs (all labs ordered are listed, but only abnormal results are displayed) Labs Reviewed  HCG, QUANTITATIVE, PREGNANCY - Abnormal; Notable for the following components:      Result Value   hCG, Beta Chain, 14/04/2020 Mahalia Longest (*)    All other components within normal limits  CBC WITH DIFFERENTIAL/PLATELET - Abnormal; Notable for the following components:   WBC 14.5 (*)    Neutro Abs 9.1 (*)    Monocytes Absolute 1.2 (*)    All other components within normal limits    EKG None  Radiology No results found.  Procedures Procedures (including critical care time)  Medications Ordered in ED Medications - No data to display  ED Course  I have reviewed the triage vital signs and the nursing notes.  Pertinent labs & imaging results that were available during my care of the patient were reviewed by me and considered in my medical decision making (see chart for details).    MDM Rules/Calculators/A&P  Patient is a 35 year old female G2 P0-0-1-0 at estimated 6 to [redacted] weeks gestation presenting with painless vaginal bleeding that  started just prior to arrival.  Patient had miscarriage in September 2021.  Her abdominal exam is benign, beta-hCG is 107,000, and ultrasound shows an  IUP with a subchorionic hemorrhage.  Patient's blood type is be positive, thus RhoGAM is not indicated.  Patient seems appropriate for discharge with vaginal rest, no heavy lifting or strenuous activity, and follow-up with OB in the next few days for repeat hCG testing and recheck.  Final Clinical Impression(s) / ED Diagnoses Final diagnoses:  None    Rx / DC Orders ED Discharge Orders    None       Geoffery Lyons, MD 10/06/20 0040

## 2020-10-05 NOTE — ED Triage Notes (Signed)
Pt with vaginal bleeding that started about 30 minutes ago.  Pt pregnant about 6-7 weeks.  denies any pain at this time.

## 2020-10-06 DIAGNOSIS — O209 Hemorrhage in early pregnancy, unspecified: Secondary | ICD-10-CM | POA: Diagnosis not present

## 2020-10-06 DIAGNOSIS — Z3A01 Less than 8 weeks gestation of pregnancy: Secondary | ICD-10-CM | POA: Diagnosis not present

## 2020-10-06 NOTE — Discharge Instructions (Addendum)
Vaginal rest, meaning no tampons in the vagina or sexual activity for the next 2 weeks.  No heavy lifting or strenuous activity until approved by your OB.  Return to the emergency department if you develop severe abdominal pain, severe bleeding, high fevers, or other new and concerning symptoms.

## 2020-10-06 NOTE — ED Notes (Signed)
ED Provider at bedside. 

## 2020-10-16 DIAGNOSIS — Z3689 Encounter for other specified antenatal screening: Secondary | ICD-10-CM | POA: Diagnosis not present

## 2020-10-16 DIAGNOSIS — O09521 Supervision of elderly multigravida, first trimester: Secondary | ICD-10-CM | POA: Diagnosis not present

## 2020-10-16 DIAGNOSIS — O26841 Uterine size-date discrepancy, first trimester: Secondary | ICD-10-CM | POA: Diagnosis not present

## 2020-10-16 DIAGNOSIS — N912 Amenorrhea, unspecified: Secondary | ICD-10-CM | POA: Diagnosis not present

## 2020-10-16 DIAGNOSIS — Z3201 Encounter for pregnancy test, result positive: Secondary | ICD-10-CM | POA: Diagnosis not present

## 2020-10-18 DIAGNOSIS — J029 Acute pharyngitis, unspecified: Secondary | ICD-10-CM | POA: Diagnosis not present

## 2020-10-18 DIAGNOSIS — Z681 Body mass index (BMI) 19 or less, adult: Secondary | ICD-10-CM | POA: Diagnosis not present

## 2020-10-28 DIAGNOSIS — Z8774 Personal history of (corrected) congenital malformations of heart and circulatory system: Secondary | ICD-10-CM | POA: Diagnosis not present

## 2020-10-28 DIAGNOSIS — Z31430 Encounter of female for testing for genetic disease carrier status for procreative management: Secondary | ICD-10-CM | POA: Diagnosis not present

## 2020-10-28 DIAGNOSIS — O09521 Supervision of elderly multigravida, first trimester: Secondary | ICD-10-CM | POA: Diagnosis not present

## 2020-10-28 DIAGNOSIS — Z3A09 9 weeks gestation of pregnancy: Secondary | ICD-10-CM | POA: Diagnosis not present

## 2020-10-31 DIAGNOSIS — O468X1 Other antepartum hemorrhage, first trimester: Secondary | ICD-10-CM | POA: Diagnosis not present

## 2020-10-31 DIAGNOSIS — O09521 Supervision of elderly multigravida, first trimester: Secondary | ICD-10-CM | POA: Diagnosis not present

## 2020-10-31 DIAGNOSIS — O418X10 Other specified disorders of amniotic fluid and membranes, first trimester, not applicable or unspecified: Secondary | ICD-10-CM | POA: Diagnosis not present

## 2020-11-03 NOTE — Progress Notes (Unsigned)
Cardiology Office Note:    Date:  11/07/2020   ID:  [name] [name], [name] [name] [birth year], MRN [id number]  PCP:  Assunta Found, MD   [facility]  Cardiologist:  Meriam Sprague, MD  Advanced Practice Provider:  No care team member to display Electrophysiologist:  None   Referring MD: Orbie Hurst, NP    History of Present Illness:    [name] [name] is a 35 y.o. female with a hx of ASD s/p repair at age 72 and HTN who is currently pregnant who was referred by Quentin Mulling, NP for further evaluation of her ASD repair.  Patient is currently [redacted] weeks pregnant and overall feels well. States she was diagnosed with an ASD incidentally after getting dehydrated in high school and was taken to the ER. She was planned for percutaneous closure but it the size of the ASD was too big to do percutaneously. Therefore, she was referred to [facility] and she eventual surgical closure at age 28. Had irregular follow-up with Cardiology since that time due to insurance issues. TTE in 2012 with no residual defect. Was previous smoker but quit due to pregnancy. Overall, the patient feels well. No palpitations, orthopnea, SOB with exertion, LE edema. Started noom prior to first pregnancy and was walking regularly. Gained weight with first pregnancy and then had pregnancy loss and she went into depression where weight continued to come on. She is now pregnant again and is motivated to lose weight and eat healthily again.  Family history: No known heart disease. No other family members with known CHD.   Past Medical History:  Diagnosis Date  . ASD (atrial septal defect)   . Essential hypertension, benign   . Mitral valve prolapse     Past Surgical History:  Procedure Laterality Date  . ASD REPAIR  2002   [facility]   . CARDIAC SURGERY    . CHOLECYSTECTOMY  2011   [facility] - Dr. Lovell Sheehan   . DILATION AND CURETTAGE OF UTERUS N/A 05/24/2020   Procedure: SUCTION DILATATION AND  CURETTAGE;  Surgeon: [name], MD;  Location: [facility];  Service: Gynecology;  Laterality: N/A;    Current Medications: Current Meds  Medication Sig  . acetaminophen (TYLENOL) 500 MG tablet Take 500-1,000 mg by mouth every 6 (six) hours as needed for headache.  . Prenatal Vit-Fe Fumarate-FA (PNV PRENATAL PLUS MULTIVITAMIN) 27-1 MG TABS Take 1 tablet by mouth daily.  . progesterone (PROMETRIUM) 200 MG capsule Place vaginally at bedtime.  . [DISCONTINUED] ibuprofen (ADVIL) 600 MG tablet Take 1 tablet (600 mg total) by mouth every 6 (six) hours as needed.     Allergies:   Amoxicillin, Other, and Penicillins   Social History   Socioeconomic History  . Marital status: Single    Spouse name: Not on file  . Number of children: Not on file  . Years of education: Not on file  . Highest education level: Not on file  Occupational History  . Occupation: Horticulturist, commercial   Tobacco Use  . Smoking status: Former Smoker    Packs/day: 1.00    Years: 20.00    Pack years: 20.00    Types: Cigarettes    Quit date: 03/26/2020    Years since quitting: 0.6  . Smokeless tobacco: Never Used  . Tobacco comment: pt advised she did eletric cig for 6 months until 05-15-13  Vaping Use  . Vaping Use: Never used  Substance and Sexual Activity  .  Alcohol use: Yes    Comment: occasionally  . Drug use: No  . Sexual activity: Not Currently    Birth control/protection: None  Other Topics Concern  . Not on file  Social History Narrative               Social Determinants of Health   Financial Resource Strain: Not on file  Food Insecurity: Not on file  Transportation Needs: Not on file  Physical Activity: Not on file  Stress: Not on file  Social Connections: Not on file     Family History: The patient's family history includes Anxiety disorder in her father, maternal grandmother, and mother; Blindness in her paternal grandmother; Cancer in her father, paternal grandmother, and sister; Cervical  cancer in her maternal aunt; Colon cancer in her maternal aunt, maternal grandfather, and paternal grandfather; Hypertension in her maternal grandmother; Migraines in her maternal aunt; Other in her maternal grandmother; Pneumonia in her paternal grandfather; Post-traumatic stress disorder in her father; Rheum arthritis in her maternal grandmother; Stroke in her mother.  ROS:   Please see the history of present illness.    Review of Systems  Constitutional: Negative for chills, fever and malaise/fatigue.  HENT: Negative for hearing loss.   Eyes: Negative for blurred vision and redness.  Respiratory: Negative for shortness of breath.   Cardiovascular: Negative for chest pain, palpitations, orthopnea, claudication, leg swelling and PND.  Gastrointestinal: Positive for constipation. Negative for blood in stool, nausea and vomiting.  Genitourinary: Positive for frequency. Negative for dysuria.  Musculoskeletal: Negative for falls.  Neurological: Negative for dizziness and loss of consciousness.  Psychiatric/Behavioral: The patient is nervous/anxious.     EKGs/Labs/Other Studies Reviewed:    The following studies were reviewed today: TTE 2011/01/09: Left ventricle: The cavity size was normal. Wall thickness was  normal. Systolic function was normal. The estimated ejection  fraction was in the range of 55% to 60%. Wall motion was normal;  there were no regional wall motion abnormalities.   --------------------------------------------------------------------  Aortic valve: Trileaflet; normal thickness leaflets. Mobility was  not restricted. Doppler: Transvalvular velocity was within the  normal range. There was no stenosis. No regurgitation.   --------------------------------------------------------------------  Aorta: Aortic root: The aortic root was normal in size.  Ascending aorta: The ascending aorta was normal in size.   --------------------------------------------------------------------   Mitral valve: Structurally normal valve. Mobility was not  restricted. Doppler: Transvalvular velocity was within the normal  range. There was no evidence for stenosis. Trivial regurgitation.  Peak gradient: 42mm Hg (D).   --------------------------------------------------------------------  Left atrium: The atrium was normal in size.   --------------------------------------------------------------------  Atrial septum: No defect or patent foramen ovale was identified.   --------------------------------------------------------------------  Right ventricle: The cavity size was normal. Wall thickness was  normal. Systolic function was normal.   --------------------------------------------------------------------  Pulmonic valve: Doppler: Transvalvular velocity was within the  normal range. There was no evidence for stenosis.   --------------------------------------------------------------------  Tricuspid valve: Structurally normal valve. Doppler: Transvalvular  velocity was within the normal range. No regurgitation.   --------------------------------------------------------------------  Pulmonary artery: The main pulmonary artery was normal-sized.  Systolic pressure could not be accurately estimated.   --------------------------------------------------------------------  Right atrium: The atrium was normal in size.   --------------------------------------------------------------------  Pericardium: There was no pericardial effusion.   --------------------------------------------------------------------  Systemic veins:  Inferior vena cava: The vessel was normal in size.    EKG:  EKG is  ordered today.  The ekg ordered today demonstrates NSR, normal axis, HR 68  Recent Labs: 05/22/2020: BUN 11; Creatinine, Ser 0.74; Potassium 4.0; Sodium 140 10/05/2020: Hemoglobin 14.7; Platelets 180  Recent Lipid Panel No results found for: CHOL, TRIG, HDL, CHOLHDL, VLDL, LDLCALC,  LDLDIRECT    Physical Exam:    VS:  BP 118/74   Pulse 68   Ht 5\' 4"  (1.626 m)   Wt 212 lb (96.2 kg)   LMP 08/21/2020   SpO2 98%   BMI 36.39 kg/m     Wt Readings from Last 3 Encounters:  11/07/20 212 lb (96.2 kg)  10/05/20 211 lb 9.6 oz (96 kg)  06/05/20 200 lb (90.7 kg)     GEN:  Well nourished, well developed in no acute distress HEENT: Normal NECK: No JVD; No carotid bruits CARDIAC: RRR, no murmurs, rubs, gallops RESPIRATORY:  Clear to auscultation without rales, wheezing or rhonchi  ABDOMEN: Gravid. Soft, non-tender, non-distended MUSCULOSKELETAL:  No edema; No deformity  SKIN: Warm and dry NEUROLOGIC:  Alert and oriented x 3 PSYCHIATRIC:  Normal affect   ASSESSMENT:    1. ATRIAL SEPTAL DEFECT   2. TOBACCO ABUSE    PLAN:    In order of problems listed above:  #ASD s/p Repair: ASD discovered incidentally as a teen when she was hospitalized for dehydration. Underwent surgical closure at [facility] at age 73 (failed percutaneous closure as ASD too large). Has been doing well since that time. TTE in 2012 with no residual leak. Normal LVEF no significant valvular abnormalities. Fortunately, patient is asymptomatic with no exertional or HF symptoms. -Repeat TTE to reassess ASD site -Fortunately, even if residual leak, ASD is a low risk congenital lesion in pregnancy as long as no evidence of shunt reversal (fortunately, patient is asymptomatic with no signs/symptoms to suggest R-->L shunting) -Patient planned for fetal cardiac ultrasound for screening as well -Follow-up with OBGYN as scheduled  #Tobacco Use: Quit with pregnancy. Discussed that abstinence significantly decreases her risk of future CV events. -Patinet has successfully quit for her pregnancy -Will need continued encouragement to abstain in the future   Medication Adjustments/Labs and Tests Ordered: Current medicines are reviewed at length with the patient today.  Concerns regarding medicines are outlined  above.  Orders Placed This Encounter  Procedures  . EKG 12-Lead  . ECHOCARDIOGRAM COMPLETE   No orders of the defined types were placed in this encounter.   Patient Instructions  Medication Instructions:  Your provider recommends that you continue on your current medications as directed. Please refer to the Current Medication list given to you today.   *If you need a refill on your cardiac medications before your next appointment, please call your pharmacy*  Testing/Procedures: Your provider has requested that you have an echocardiogram. Echocardiography is a painless test that uses sound waves to create images of your heart. It provides your doctor with information about the size and shape of your heart and how well your heart's chambers and valves are working. This procedure takes approximately one hour. There are no restrictions for this procedure.    Follow-Up: Your provider recommends that you schedule a follow-up appointment AS NEEDED pending study results.    Signed, 2013, MD  11/07/2020 11:31 AM    [facility]

## 2020-11-07 ENCOUNTER — Encounter: Payer: Self-pay | Admitting: Cardiology

## 2020-11-07 ENCOUNTER — Other Ambulatory Visit: Payer: Self-pay

## 2020-11-07 ENCOUNTER — Ambulatory Visit (INDEPENDENT_AMBULATORY_CARE_PROVIDER_SITE_OTHER): Payer: BC Managed Care – PPO | Admitting: Cardiology

## 2020-11-07 VITALS — BP 118/74 | HR 68 | Ht 64.0 in | Wt 212.0 lb

## 2020-11-07 DIAGNOSIS — F17200 Nicotine dependence, unspecified, uncomplicated: Secondary | ICD-10-CM | POA: Diagnosis not present

## 2020-11-07 DIAGNOSIS — Q211 Atrial septal defect: Secondary | ICD-10-CM

## 2020-11-07 DIAGNOSIS — Q2111 Secundum atrial septal defect: Secondary | ICD-10-CM

## 2020-11-07 NOTE — Patient Instructions (Signed)
Medication Instructions:  Your provider recommends that you continue on your current medications as directed. Please refer to the Current Medication list given to you today.   *If you need a refill on your cardiac medications before your next appointment, please call your pharmacy*  Testing/Procedures: Your provider has requested that you have an echocardiogram. Echocardiography is a painless test that uses sound waves to create images of your heart. It provides your doctor with information about the size and shape of your heart and how well your heart's chambers and valves are working. This procedure takes approximately one hour. There are no restrictions for this procedure.     Follow-Up: Your provider recommends that you schedule a follow-up appointment AS NEEDED pending study results. 

## 2020-11-14 DIAGNOSIS — O418X10 Other specified disorders of amniotic fluid and membranes, first trimester, not applicable or unspecified: Secondary | ICD-10-CM | POA: Diagnosis not present

## 2020-11-14 DIAGNOSIS — Z3689 Encounter for other specified antenatal screening: Secondary | ICD-10-CM | POA: Diagnosis not present

## 2020-11-14 DIAGNOSIS — Z3682 Encounter for antenatal screening for nuchal translucency: Secondary | ICD-10-CM | POA: Diagnosis not present

## 2020-11-14 DIAGNOSIS — O468X1 Other antepartum hemorrhage, first trimester: Secondary | ICD-10-CM | POA: Diagnosis not present

## 2020-11-19 ENCOUNTER — Ambulatory Visit (HOSPITAL_COMMUNITY): Payer: BC Managed Care – PPO | Attending: Cardiovascular Disease

## 2020-11-19 ENCOUNTER — Other Ambulatory Visit: Payer: Self-pay

## 2020-11-19 DIAGNOSIS — Q211 Atrial septal defect: Secondary | ICD-10-CM | POA: Insufficient documentation

## 2020-11-19 DIAGNOSIS — Q2111 Secundum atrial septal defect: Secondary | ICD-10-CM

## 2020-11-19 LAB — ECHOCARDIOGRAM COMPLETE
Area-P 1/2: 2.8 cm2
S' Lateral: 3.5 cm

## 2020-11-21 ENCOUNTER — Encounter (HOSPITAL_COMMUNITY): Payer: Self-pay | Admitting: Emergency Medicine

## 2020-11-21 ENCOUNTER — Emergency Department (HOSPITAL_COMMUNITY): Payer: BC Managed Care – PPO

## 2020-11-21 ENCOUNTER — Emergency Department (HOSPITAL_COMMUNITY)
Admission: EM | Admit: 2020-11-21 | Discharge: 2020-11-22 | Disposition: A | Discharge status: Home / Self Care | Payer: BC Managed Care – PPO | Attending: Emergency Medicine | Admitting: Emergency Medicine

## 2020-11-21 ENCOUNTER — Other Ambulatory Visit: Payer: Self-pay

## 2020-11-21 DIAGNOSIS — E876 Hypokalemia: Secondary | ICD-10-CM | POA: Diagnosis not present

## 2020-11-21 DIAGNOSIS — O26851 Spotting complicating pregnancy, first trimester: Secondary | ICD-10-CM | POA: Diagnosis not present

## 2020-11-21 DIAGNOSIS — O99711 Diseases of the skin and subcutaneous tissue complicating pregnancy, first trimester: Secondary | ICD-10-CM | POA: Insufficient documentation

## 2020-11-21 DIAGNOSIS — I10 Essential (primary) hypertension: Secondary | ICD-10-CM | POA: Diagnosis not present

## 2020-11-21 DIAGNOSIS — O209 Hemorrhage in early pregnancy, unspecified: Secondary | ICD-10-CM

## 2020-11-21 DIAGNOSIS — L239 Allergic contact dermatitis, unspecified cause: Secondary | ICD-10-CM | POA: Insufficient documentation

## 2020-11-21 DIAGNOSIS — Z3A13 13 weeks gestation of pregnancy: Secondary | ICD-10-CM | POA: Diagnosis not present

## 2020-11-21 DIAGNOSIS — O4691 Antepartum hemorrhage, unspecified, first trimester: Secondary | ICD-10-CM | POA: Insufficient documentation

## 2020-11-21 DIAGNOSIS — O99281 Endocrine, nutritional and metabolic diseases complicating pregnancy, first trimester: Secondary | ICD-10-CM | POA: Diagnosis not present

## 2020-11-21 DIAGNOSIS — O208 Other hemorrhage in early pregnancy: Secondary | ICD-10-CM | POA: Insufficient documentation

## 2020-11-21 DIAGNOSIS — Z87891 Personal history of nicotine dependence: Secondary | ICD-10-CM | POA: Insufficient documentation

## 2020-11-21 DIAGNOSIS — O468X1 Other antepartum hemorrhage, first trimester: Secondary | ICD-10-CM

## 2020-11-21 DIAGNOSIS — R58 Hemorrhage, not elsewhere classified: Secondary | ICD-10-CM

## 2020-11-21 DIAGNOSIS — O418X10 Other specified disorders of amniotic fluid and membranes, first trimester, not applicable or unspecified: Secondary | ICD-10-CM

## 2020-11-21 LAB — CBC WITH DIFFERENTIAL/PLATELET
Abs Immature Granulocytes: 0.05 10*3/uL (ref 0.00–0.07)
Basophils Absolute: 0.1 10*3/uL (ref 0.0–0.1)
Basophils Relative: 0 %
Eosinophils Absolute: 0.3 10*3/uL (ref 0.0–0.5)
Eosinophils Relative: 2 %
HCT: 39.4 % (ref 36.0–46.0)
Hemoglobin: 12.9 g/dL (ref 12.0–15.0)
Immature Granulocytes: 0 %
Lymphocytes Relative: 16 %
Lymphs Abs: 2.3 10*3/uL (ref 0.7–4.0)
MCH: 29.1 pg (ref 26.0–34.0)
MCHC: 32.7 g/dL (ref 30.0–36.0)
MCV: 88.7 fL (ref 80.0–100.0)
Monocytes Absolute: 1.1 10*3/uL — ABNORMAL HIGH (ref 0.1–1.0)
Monocytes Relative: 7 %
Neutro Abs: 11.3 10*3/uL — ABNORMAL HIGH (ref 1.7–7.7)
Neutrophils Relative %: 75 %
Platelets: 175 10*3/uL (ref 150–400)
RBC: 4.44 MIL/uL (ref 3.87–5.11)
RDW: 13 % (ref 11.5–15.5)
WBC: 15.1 10*3/uL — ABNORMAL HIGH (ref 4.0–10.5)
nRBC: 0 % (ref 0.0–0.2)

## 2020-11-21 LAB — COMPREHENSIVE METABOLIC PANEL
ALT: 15 U/L (ref 0–44)
AST: 17 U/L (ref 15–41)
Albumin: 3.3 g/dL — ABNORMAL LOW (ref 3.5–5.0)
Alkaline Phosphatase: 57 U/L (ref 38–126)
Anion gap: 8 (ref 5–15)
BUN: 10 mg/dL (ref 6–20)
CO2: 22 mmol/L (ref 22–32)
Calcium: 9.2 mg/dL (ref 8.9–10.3)
Chloride: 107 mmol/L (ref 98–111)
Creatinine, Ser: 0.54 mg/dL (ref 0.44–1.00)
GFR, Estimated: >60 mL/min (ref >60)
Glucose, Bld: 94 mg/dL (ref 70–99)
Potassium: 3.3 mmol/L — ABNORMAL LOW (ref 3.5–5.1)
Sodium: 137 mmol/L (ref 135–145)
Total Bilirubin: 0.2 mg/dL — ABNORMAL LOW (ref 0.3–1.2)
Total Protein: 6.4 g/dL — ABNORMAL LOW (ref 6.5–8.1)

## 2020-11-21 LAB — ABO/RH: ABO/RH(D): B POS

## 2020-11-21 LAB — HCG, QUANTITATIVE, PREGNANCY: hCG, Beta Chain, Quant, S: 206204 m[IU]/mL — ABNORMAL HIGH (ref <5)

## 2020-11-21 NOTE — ED Provider Notes (Signed)
[facility] EMERGENCY DEPARTMENT Provider Note   CSN: [id number] Arrival date & time: 11/21/20  2122   History Chief Complaint  Patient presents with  . Vaginal Bleeding    [name] [name] is a 35 y.o. female.  The history is provided by the patient.  Vaginal Bleeding She has history of mitral valve prolapse, hypertension and is currently [redacted] weeks pregnant.  She comes in with vaginal bleeding which started tonight.  She denies any abdominal cramping or pain.  There has not been sufficient blood to saturate a pad and has not passed any clots.  Current pregnancy was complicated by vaginal bleeding at 6 weeks and she was told that she had a subchorionic hemorrhage which apparently had resolved on repeat ultrasound.  She is gravida 2 para 0 with 1 prior miscarriage.  She has started prenatal care.  As a second complaint, she has had a pruritic rash on her left thigh.  Is been present for about a week.  She has been putting calamine lotion and some antiitch cream on it without relief.  She cannot recall any unusual exposure.  Past Medical History:  Diagnosis Date  . ASD (atrial septal defect)   . Essential hypertension, benign   . Mitral valve prolapse     Patient Active Problem List   Diagnosis Date Noted  . TOBACCO ABUSE 10/15/2010  . Essential hypertension, benign 07/03/2009  . MITRAL VALVE PROLAPSE 07/03/2009  . ATRIAL SEPTAL DEFECT 07/03/2009    Past Surgical History:  Procedure Laterality Date  . ASD REPAIR  2002   [facility]   . CARDIAC SURGERY    . CHOLECYSTECTOMY  2011   [facility] - Dr. Lovell Sheehan   . DILATION AND CURETTAGE OF UTERUS N/A 05/24/2020   Procedure: SUCTION DILATATION AND CURETTAGE;  Surgeon: [name], MD;  Location: [facility];  Service: Gynecology;  Laterality: N/A;     OB History    Gravida  2   Para  0   Term  0   Preterm  0   AB  0   Living  0     SAB  0   IAB  0   Ectopic  0   Multiple  0   Live Births  0            Family History  Problem Relation Age of Onset  . Pneumonia Paternal Grandfather   . Colon cancer Paternal Grandfather   . Cancer Paternal Grandmother        skin  . Blindness Paternal Grandmother   . Other Maternal Grandmother        vertigo  . Hypertension Maternal Grandmother   . Rheum arthritis Maternal Grandmother   . Anxiety disorder Maternal Grandmother   . Colon cancer Maternal Grandfather   . Cancer Father        rectal and colon  . Post-traumatic stress disorder Father   . Anxiety disorder Father   . Stroke Mother   . Anxiety disorder Mother   . Cancer Sister        bladder  . Migraines Maternal Aunt   . Colon cancer Maternal Aunt   . Cervical cancer Maternal Aunt     Social History   Tobacco Use  . Smoking status: Former Smoker    Packs/day: 1.00    Years: 20.00    Pack years: 20.00    Types: Cigarettes    Quit date: 03/26/2020    Years since quitting: 0.6  .  Smokeless tobacco: Never Used  . Tobacco comment: pt advised she did eletric cig for 6 months until 05-15-13  Vaping Use  . Vaping Use: Never used  Substance Use Topics  . Alcohol use: Yes    Comment: occasionally  . Drug use: No    Home Medications Prior to Admission medications   Medication Sig Start Date End Date Taking? Authorizing Provider  acetaminophen (TYLENOL) 500 MG tablet Take 500-1,000 mg by mouth every 6 (six) hours as needed for headache.    [provider]  Prenatal Vit-Fe Fumarate-FA (PNV PRENATAL PLUS MULTIVITAMIN) 27-1 MG TABS Take 1 tablet by mouth daily.    [provider]  progesterone (PROMETRIUM) 200 MG capsule Place vaginally at bedtime. 10/17/20   [provider]    Allergies    Amoxicillin, Other, and Penicillins  Review of Systems   Review of Systems  Genitourinary: Positive for vaginal bleeding.  All other systems reviewed and are negative.   Physical Exam Updated Vital Signs BP (!) 173/85   Pulse 80   Temp 98.2 F (36.8 C)    Resp 18   Ht 5\' 4"  (1.626 m)   Wt 95.7 kg   LMP 08/21/2020   SpO2 98%   BMI 36.22 kg/m   Physical Exam Vitals and nursing note reviewed.   35 year old female, resting comfortably and in no acute distress. Vital signs are significant for elevated blood pressure. Oxygen saturation is 98%, which is normal. Head is normocephalic and atraumatic. PERRLA, EOMI. Oropharynx is clear. Neck is nontender and supple without adenopathy or JVD. Back is nontender and there is no CVA tenderness. Lungs are clear without rales, wheezes, or rhonchi. Chest is nontender. Heart has regular rate and rhythm without murmur. Abdomen is soft, flat, nontender without masses or hepatosplenomegaly and peristalsis is normoactive. Pelvic: Normal external female genitalia.  Small amount of dark red blood present in the vaginal vault.  Cervix is closed.  On bimanual exam, fundus is approximately 12-14 weeks size and nontender.. Extremities have no cyanosis or edema, full range of motion is present. Skin is warm and dry.  Vesicular rash in linear pattern present on both medial thighs consistent with contact dermatitis. Neurologic: Mental status is normal, cranial nerves are intact, there are no motor or sensory deficits.  ED Results / Procedures / Treatments   Labs (all labs ordered are listed, but only abnormal results are displayed) Labs Reviewed  CBC WITH DIFFERENTIAL/PLATELET - Abnormal; Notable for the following components:      Result Value   WBC 15.1 (*)    Neutro Abs 11.3 (*)    Monocytes Absolute 1.1 (*)    All other components within normal limits  COMPREHENSIVE METABOLIC PANEL - Abnormal; Notable for the following components:   Potassium 3.3 (*)    Total Protein 6.4 (*)    Albumin 3.3 (*)    Total Bilirubin 0.2 (*)    All other components within normal limits  HCG, QUANTITATIVE, PREGNANCY - Abnormal; Notable for the following components:   hCG, Beta Chain, Quant, S 206,204 (*)    All other  components within normal limits  ABO/RH    Radiology 03-30-1999 OB LESS THAN 14 WEEKS WITH OB TRANSVAGINAL  Addendum Date: 11/22/2020   ADDENDUM REPORT: 11/22/2020 01:12 ADDENDUM: These results were called by telephone at the time of interpretation on 11/22/2020 at 1:12 am to provider Jaeda Bruso [facility] , who verbally acknowledged these results. Electronically Signed   By: [facility] MD  On: 11/22/2020 01:12   Result Date: 11/22/2020 CLINICAL DATA:  Pregnant, vaginal bleeding, LMP 05/28/2021 EXAM: LIMITED OBSTETRIC ULTRASOUND AND TRANSVAGINAL OBSTETRIC ULTRASOUND COMPARISON:  10/05/2020 FINDINGS: Number of Fetuses: 1 Heart Rate:  153 bpm Movement: Present Presentation: Transverse Placental Location: Posterior fundus. There is a large subchorionic hemorrhage seen within the lower uterine segment extending across the internal cervical os Previa: None Amniotic Fluid (Subjective): Within normal limits. BPD:  1.22cm 13w 4d MATERNAL FINDINGS: Cervix:  Appears closed. Uterus/Adnexae: 2.5 cm submucosal fibroid again noted within the anterior fundus. Small retroplacental hematoma identified measuring 14 mm x 29 mm, best seen on image # 17. No free fluid within the cul-de-sac. IMPRESSION: Large subchorionic hemorrhage extending across the internal cervical os. Small retroplacental hematoma noted. Single living intrauterine gestation with appropriate interval growth since prior examination. This exam is performed on an emergent basis and does not comprehensively evaluate fetal size, dating, or anatomy; follow-up complete OB US should be considered if further fetal assessment is warranted. Electronically Signed: By: Helyn Numbers MD On: 11/22/2020 01:08    Procedures Procedures   Medications Ordered in ED Medications  predniSONE (DELTASONE) tablet 40 mg (has no administration in time range)  potassium chloride SA (KLOR-CON) CR tablet 40 mEq (has no administration in time range)    ED Course  I have reviewed the  triage vital signs and the nursing notes.  Pertinent labs & imaging results that were available during my care of the patient were reviewed by me and considered in my medical decision making (see chart for details).  MDM Rules/Calculators/A&P First trimester bleeding in patient with known intrauterine pregnancy, doubt heterotopic pregnancy.  Will get pelvic ultrasound to evaluate for possible subchorionic hemorrhage.  Contact dermatitis, will need to treat with oral steroids.  Ultrasound shows a large subchorionic hemorrhage as well as a small retroplacental hematoma.  In spite of this, fetal heart rate is appropriate and fetal movement is noted.  I have discussed the case with Dr. Donavan Foil, on-call for OB/GYN, who states that there is no need for admission at this time, but she will need to follow-up with her obstetrician in the next several days.  Patient is advised to rest.  She is concerned that she is about to start a new job in 3 days.  She is given a work release for the next week until she can be seen by her obstetrician.  Advised to return for worsening bleeding or pelvic pain.  Labs also show mild hypokalemia and she is given a dose of oral potassium.  Final Clinical Impression(s) / ED Diagnoses Final diagnoses:  First trimester bleeding  Subchorionic hematoma in first trimester, single or unspecified fetus  Allergic contact dermatitis, unspecified trigger  Hypokalemia    Rx / DC Orders ED Discharge Orders         Ordered    predniSONE (DELTASONE) 20 MG tablet  Daily        11/22/20 0213           Dione Booze, MD 11/22/20 [phone]

## 2020-11-21 NOTE — ED Triage Notes (Signed)
Pt states that she is [redacted] weeks pregnant. States that she started bleeding a moderate amount of bright red blood tonight. Pt denies cramping

## 2020-11-22 DIAGNOSIS — O4691 Antepartum hemorrhage, unspecified, first trimester: Secondary | ICD-10-CM | POA: Diagnosis not present

## 2020-11-22 DIAGNOSIS — O208 Other hemorrhage in early pregnancy: Secondary | ICD-10-CM | POA: Diagnosis not present

## 2020-11-22 DIAGNOSIS — Z3A13 13 weeks gestation of pregnancy: Secondary | ICD-10-CM | POA: Diagnosis not present

## 2020-11-22 MED ORDER — POTASSIUM CHLORIDE CRYS ER 20 MEQ PO TBCR
40.0000 meq | EXTENDED_RELEASE_TABLET | Freq: Once | ORAL | Status: AC
  Administered 2020-11-22: 40 meq via ORAL
  Filled 2020-11-22: qty 2

## 2020-11-22 MED ORDER — PREDNISONE 20 MG PO TABS
40.0000 mg | ORAL_TABLET | Freq: Once | ORAL | Status: AC
  Administered 2020-11-22: 40 mg via ORAL
  Filled 2020-11-22: qty 2

## 2020-11-22 MED ORDER — PREDNISONE 20 MG PO TABS: 20.0000 mg | ORAL_TABLET | Freq: Every day | ORAL | 0 refills | Status: DC

## 2020-11-22 NOTE — Discharge Instructions (Signed)
Do not to any lifting.  Do not have sex.  Talk with your obstetrician about whether to resume using your progesterone suppository.  You may apply hydrocortisone cream to the rash on your thighs.  Return if bleeding is getting worse or if you develop significant cramping.

## 2020-11-28 DIAGNOSIS — Z3A14 14 weeks gestation of pregnancy: Secondary | ICD-10-CM | POA: Diagnosis not present

## 2020-11-28 DIAGNOSIS — O418X10 Other specified disorders of amniotic fluid and membranes, first trimester, not applicable or unspecified: Secondary | ICD-10-CM | POA: Diagnosis not present

## 2020-11-28 DIAGNOSIS — O99891 Other specified diseases and conditions complicating pregnancy: Secondary | ICD-10-CM | POA: Diagnosis not present

## 2020-11-28 DIAGNOSIS — O418X20 Other specified disorders of amniotic fluid and membranes, second trimester, not applicable or unspecified: Secondary | ICD-10-CM | POA: Diagnosis not present

## 2020-11-28 DIAGNOSIS — O3412 Maternal care for benign tumor of corpus uteri, second trimester: Secondary | ICD-10-CM | POA: Diagnosis not present

## 2020-11-28 DIAGNOSIS — O468X1 Other antepartum hemorrhage, first trimester: Secondary | ICD-10-CM | POA: Diagnosis not present

## 2020-11-28 DIAGNOSIS — O468X2 Other antepartum hemorrhage, second trimester: Secondary | ICD-10-CM | POA: Diagnosis not present

## 2020-11-28 DIAGNOSIS — D259 Leiomyoma of uterus, unspecified: Secondary | ICD-10-CM | POA: Diagnosis not present

## 2020-12-17 DIAGNOSIS — Z3689 Encounter for other specified antenatal screening: Secondary | ICD-10-CM | POA: Diagnosis not present

## 2021-01-31 ENCOUNTER — Inpatient Hospital Stay
Admission: RE | Admit: 2021-01-31 | Discharge: 2021-01-31 | Disposition: A | Discharge status: Home / Self Care | Payer: Self-pay | Source: Ambulatory Visit

## 2021-01-31 ENCOUNTER — Ambulatory Visit
Admission: EM | Admit: 2021-01-31 | Discharge: 2021-01-31 | Disposition: A | Discharge status: Home / Self Care | Payer: BC Managed Care – PPO

## 2021-01-31 ENCOUNTER — Other Ambulatory Visit: Payer: Self-pay

## 2021-01-31 ENCOUNTER — Encounter: Payer: Self-pay | Admitting: Emergency Medicine

## 2021-01-31 DIAGNOSIS — M778 Other enthesopathies, not elsewhere classified: Secondary | ICD-10-CM

## 2021-01-31 DIAGNOSIS — G5603 Carpal tunnel syndrome, bilateral upper limbs: Secondary | ICD-10-CM

## 2021-01-31 NOTE — ED Triage Notes (Signed)
Bilateral wrist and forearm pain x 1 week

## 2021-01-31 NOTE — Discharge Instructions (Signed)
Continue conservative management of rest, ice, and elevation Continue with bracing Tylenol as needed for pain Follow up with OB for further evaluation and management Return or go to the ER if you have any new or worsening symptoms (fever, chills, chest pain, redness, swelling, bruising, deformity, etc...)

## 2021-01-31 NOTE — ED Provider Notes (Signed)
[facility]   [id number] 01/31/21 Arrival Time: 1628  CC: wrist PAIN  SUBJECTIVE: History from: patient. [name] [name] is a 35 y.o. female complains of bilateral wrist pain x 1 week.  Denies a precipitating event or specific injury.  Patient is currently [redacted] weeks pregnant and works on [employer].  Localizes the pain to the outside of wrist.  Describes the pain as intermittent and numbness/ tingling in character.  Has tried OTC tylenol and bracing without relief.  Symptoms are made worse at night.  Denies similar symptoms in the past.  Denies fever, chills, erythema, ecchymosis, effusion, weakness.  ROS: As per HPI.  All other pertinent ROS negative.     Past Medical History:  Diagnosis Date  . ASD (atrial septal defect)   . Essential hypertension, benign   . Mitral valve prolapse    Past Surgical History:  Procedure Laterality Date  . ASD REPAIR  2002   [facility]   . CARDIAC SURGERY    . CHOLECYSTECTOMY  2011   [facility] - Dr. Lovell Sheehan   . DILATION AND CURETTAGE OF UTERUS N/A 05/24/2020   Procedure: SUCTION DILATATION AND CURETTAGE;  Surgeon: [name], MD;  Location: [facility];  Service: Gynecology;  Laterality: N/A;   Allergies  Allergen Reactions  . Amoxicillin Hives    Childhood  . Other     Ivory soap breaks her out in a rash  . Penicillins Hives    Childhood   No current facility-administered medications on file prior to encounter.   Current Outpatient Medications on File Prior to Encounter  Medication Sig Dispense Refill  . acetaminophen (TYLENOL) 500 MG tablet Take 500-1,000 mg by mouth every 6 (six) hours as needed for headache.    . Prenatal Vit-Fe Fumarate-FA (PNV PRENATAL PLUS MULTIVITAMIN) 27-1 MG TABS Take 1 tablet by mouth daily.    . [DISCONTINUED] progesterone (PROMETRIUM) 200 MG capsule Place vaginally at bedtime.     Social History   Socioeconomic History  . Marital status: Single    Spouse name: Not on file  . Number of children:  Not on file  . Years of education: Not on file  . Highest education level: Not on file  Occupational History  . Occupation: Horticulturist, commercial   Tobacco Use  . Smoking status: Former Smoker    Packs/day: 1.00    Years: 20.00    Pack years: 20.00    Types: Cigarettes    Quit date: 03/26/2020    Years since quitting: 0.8  . Smokeless tobacco: Never Used  . Tobacco comment: pt advised she did eletric cig for 6 months until 05-15-13  Vaping Use  . Vaping Use: Never used  Substance and Sexual Activity  . Alcohol use: Yes    Comment: occasionally  . Drug use: No  . Sexual activity: Not Currently    Birth control/protection: None  Other Topics Concern  . Not on file  Social History Narrative               Social Determinants of Health   Financial Resource Strain: Not on file  Food Insecurity: Not on file  Transportation Needs: Not on file  Physical Activity: Not on file  Stress: Not on file  Social Connections: Not on file  Intimate Partner Violence: Not on file   Family History  Problem Relation Age of Onset  . Pneumonia Paternal Grandfather   . Colon cancer Paternal Grandfather   . Cancer Paternal Grandmother  skin  . Blindness Paternal Grandmother   . Other Maternal Grandmother        vertigo  . Hypertension Maternal Grandmother   . Rheum arthritis Maternal Grandmother   . Anxiety disorder Maternal Grandmother   . Colon cancer Maternal Grandfather   . Cancer Father        rectal and colon  . Post-traumatic stress disorder Father   . Anxiety disorder Father   . Stroke Mother   . Anxiety disorder Mother   . Cancer Sister        bladder  . Migraines Maternal Aunt   . Colon cancer Maternal Aunt   . Cervical cancer Maternal Aunt     OBJECTIVE:  Vitals:   01/31/21 1659  BP: 134/89  Pulse: 89  Resp: 18  Temp: 98.1 F (36.7 C)  TempSrc: Oral  SpO2: 98%    General appearance: ALERT; in no acute distress.  Head: NCAT Lungs: Normal respiratory  effort CV: Radial pulse 2+ Musculoskeletal: bilateral wrist Inspection: Skin warm, dry, clear and intact without obvious erythema, effusion, or ecchymosis.  Palpation: TTP over lateral wrists ROM: FROM active and passive Strength:  5/5 grip strength Special tests: + finkelsteins  Skin: warm and dry Neurologic: Ambulates without difficulty Psychological: alert and cooperative; normal mood and affect  ASSESSMENT & PLAN:  1. Tendinitis of wrist   2. Bilateral carpal tunnel syndrome    Continue conservative management of rest, ice, and elevation Continue with bracing Tylenol as needed for pain Follow up with OB for further evaluation and management Return or go to the ER if you have any new or worsening symptoms (fever, chills, chest pain, redness, swelling, bruising, deformity, etc...)   Reviewed expectations re: course of current medical issues. Questions answered. Outlined signs and symptoms indicating need for more acute intervention. Patient verbalized understanding. After Visit Summary given.    Rennis Harding, PA-C 01/31/21 1801

## 2021-02-27 ENCOUNTER — Telehealth: Payer: Self-pay | Admitting: Cardiology

## 2021-02-27 NOTE — Telephone Encounter (Signed)
Will have our medical records dept fax over pts recent echo report, as requested by Dr. Sharyn Creamer office.  Triage RN routed to HIM for further follow-up of requested records.

## 2021-02-27 NOTE — Telephone Encounter (Signed)
Dr. Thayer Headings is wanting [place] to fax over copy of pts recent echocardiogram from 11/19/20 please send via fax to [phone].

## 2021-02-28 NOTE — Telephone Encounter (Signed)
Their number is in this telephone encounter and copied below.  Please assist with records! Thanks!  Dr. Thayer Headings [phone]  [name] [name], [name] [name]

## 2021-04-01 ENCOUNTER — Other Ambulatory Visit: Payer: Self-pay

## 2021-04-01 ENCOUNTER — Ambulatory Visit (INDEPENDENT_AMBULATORY_CARE_PROVIDER_SITE_OTHER): Payer: Medicaid Other | Admitting: Pediatrics

## 2021-04-01 DIAGNOSIS — Z7681 Expectant parent(s) prebirth pediatrician visit: Secondary | ICD-10-CM | POA: Insufficient documentation

## 2021-04-01 NOTE — Progress Notes (Signed)
Prenatal counseling for impending newborn done. Mother will deliver at [facility]. 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 Due date is 9/14, will be induced 1-2 weeks prior to due date. No GHTN, no GDM Z76.81

## 2021-05-07 DIAGNOSIS — O1400 Mild to moderate pre-eclampsia, unspecified trimester: Secondary | ICD-10-CM

## 2021-08-06 NOTE — Progress Notes (Signed)
Erroneous encounter

## 2022-05-03 IMAGING — US US OB TRANSVAGINAL
1 series · 14 of 28 positions shown · non-contrast
Comparison: None.

CLINICAL DATA: Initial evaluation for acute vaginal bleeding, early
pregnancy.

EXAM:
OBSTETRIC <14 WK US AND TRANSVAGINAL OB US
TECHNIQUE: Both transabdominal and transvaginal ultrasound examinations were
performed for complete evaluation of the gestation as well as the
maternal uterus, adnexal regions, and pelvic cul-de-sac.
Transvaginal technique was performed to assess early pregnancy.

[Series 1: us ob transvaginal · 14 of 98 slices shown]
[im 4/98]
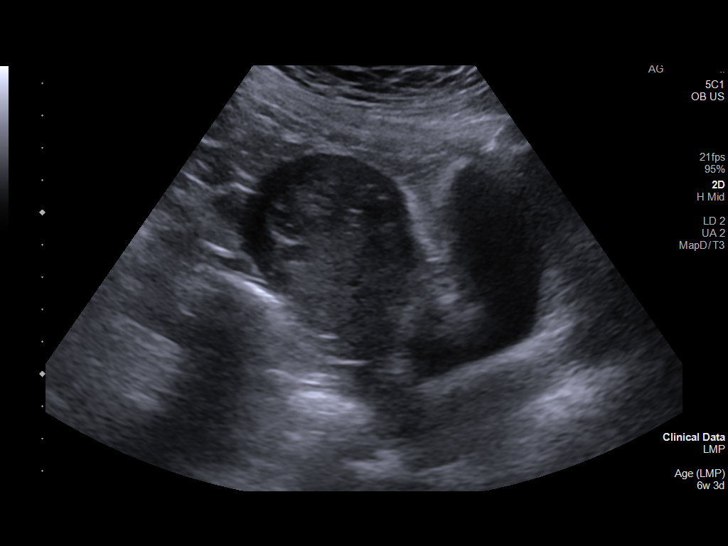
[im 11/98]
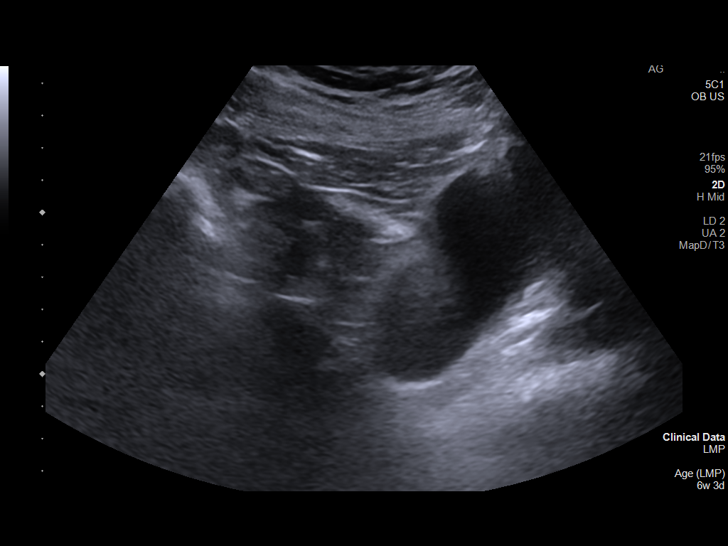
[im 18/98]
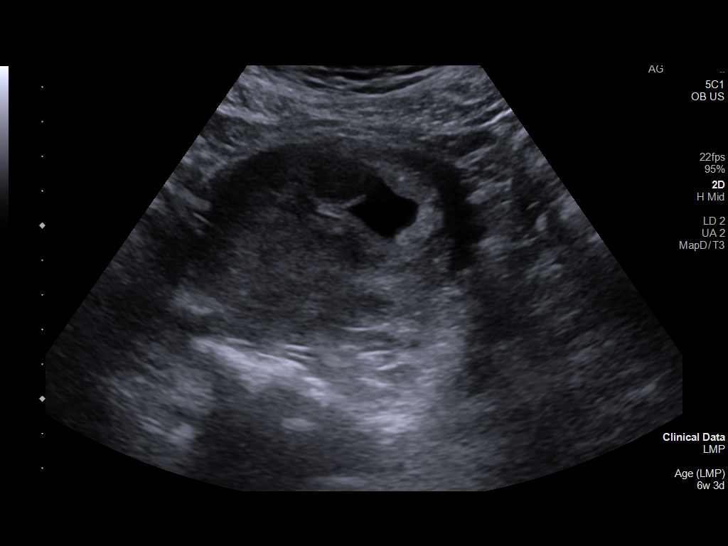
[im 26/98]
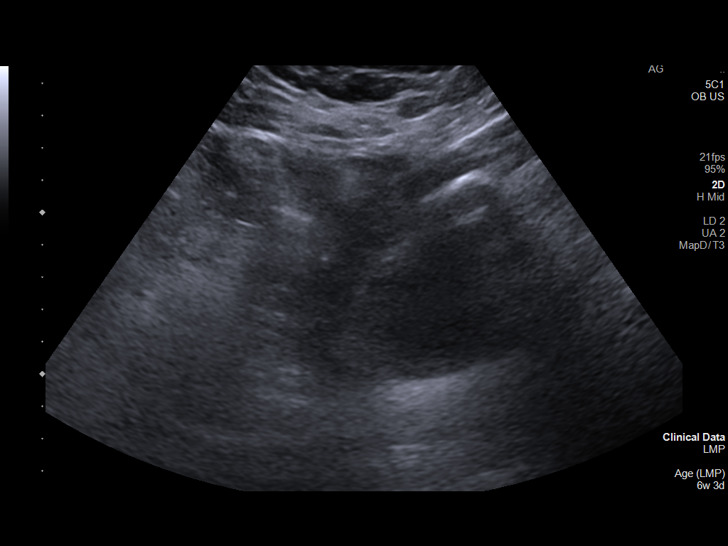
[im 33/98]
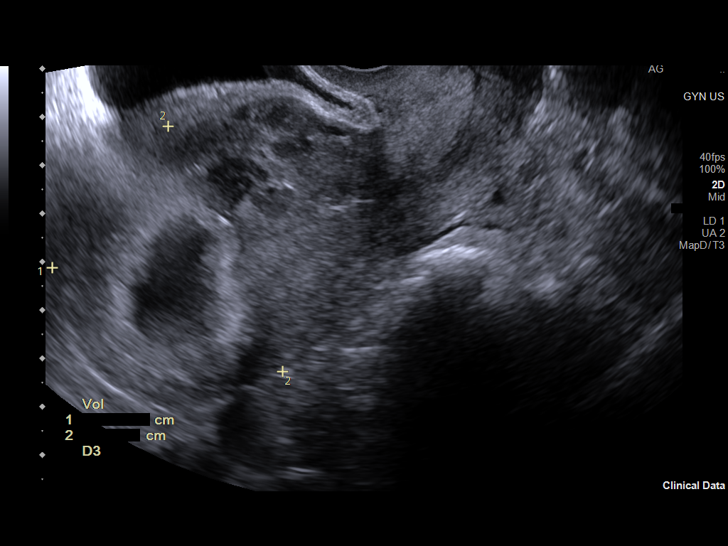
[im 40/98]
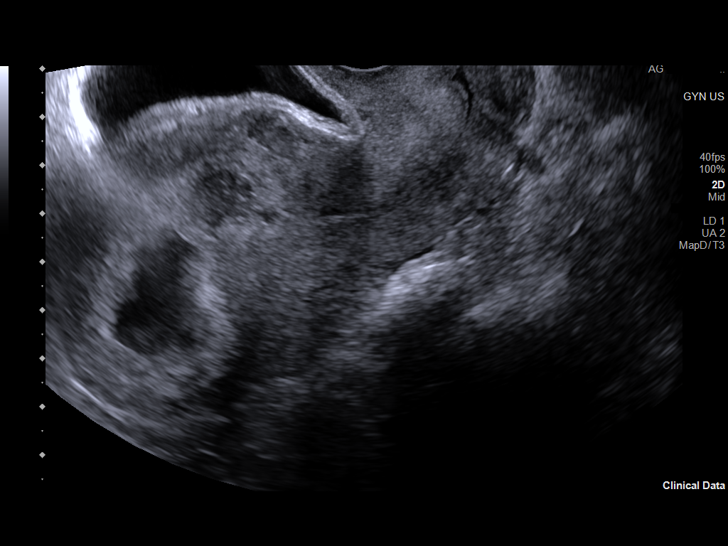
[im 47/98]
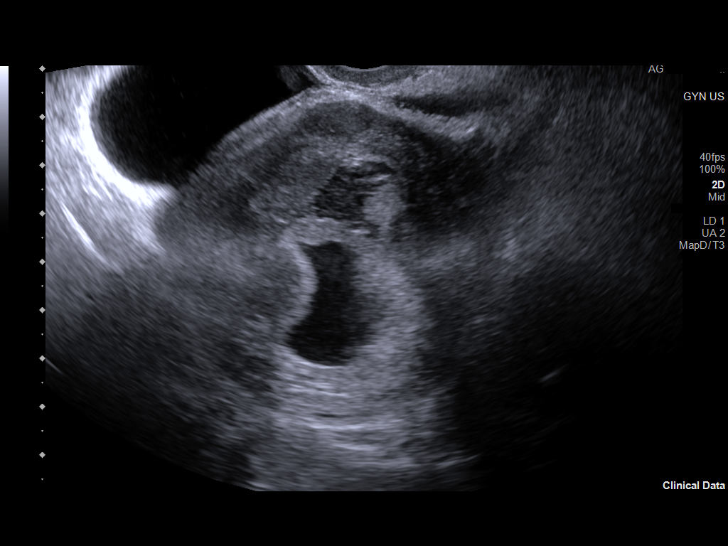
[im 54/98]
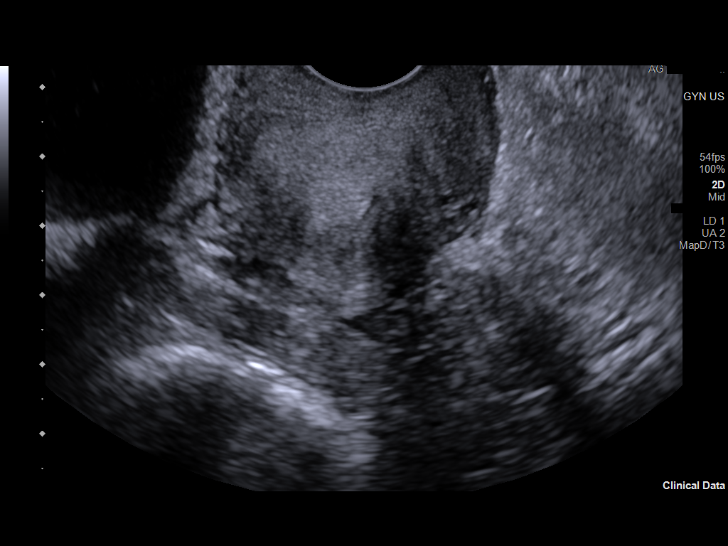
[im 62/98]
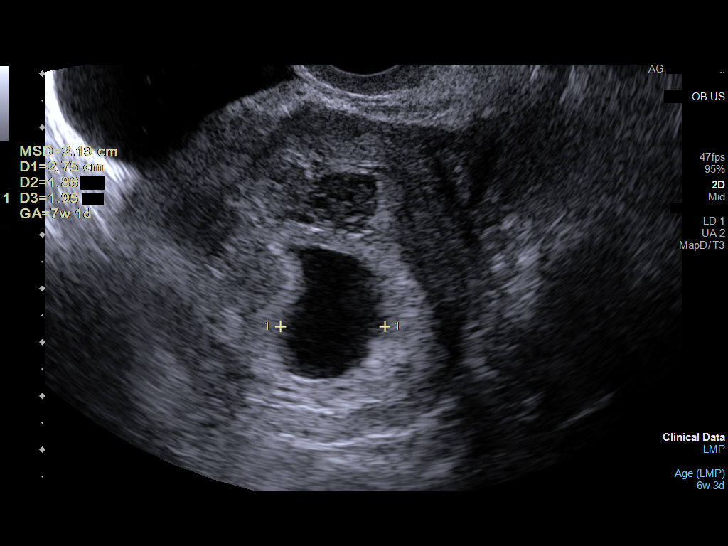
[im 69/98]
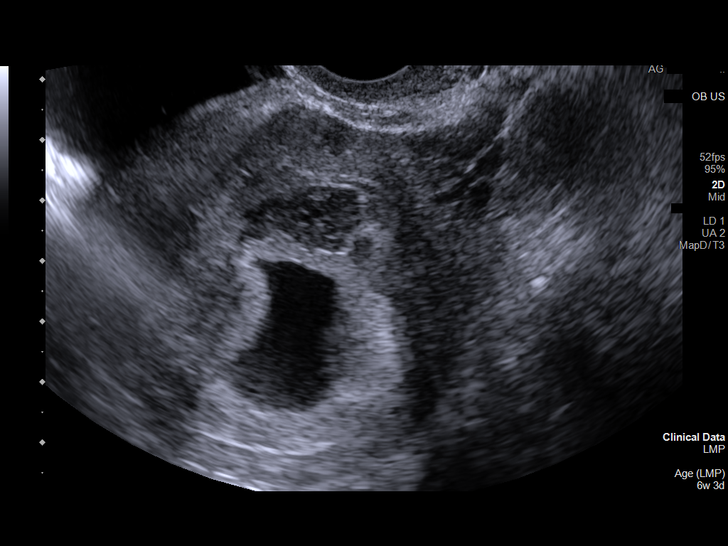
[im 76/98]
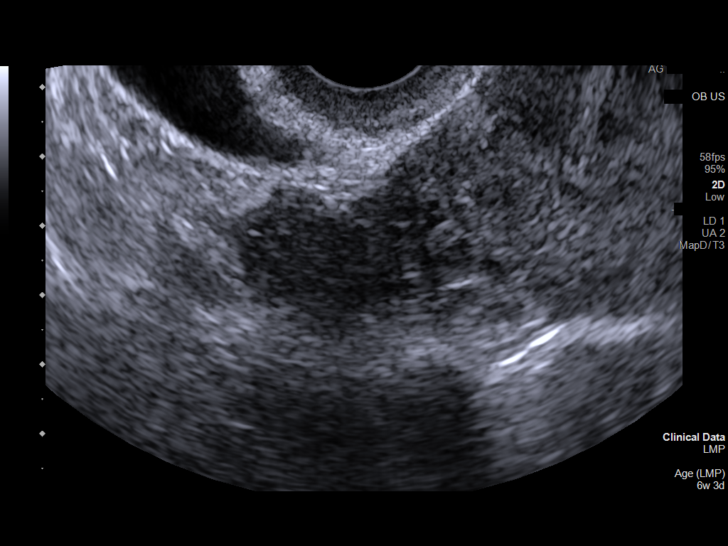
[im 83/98]
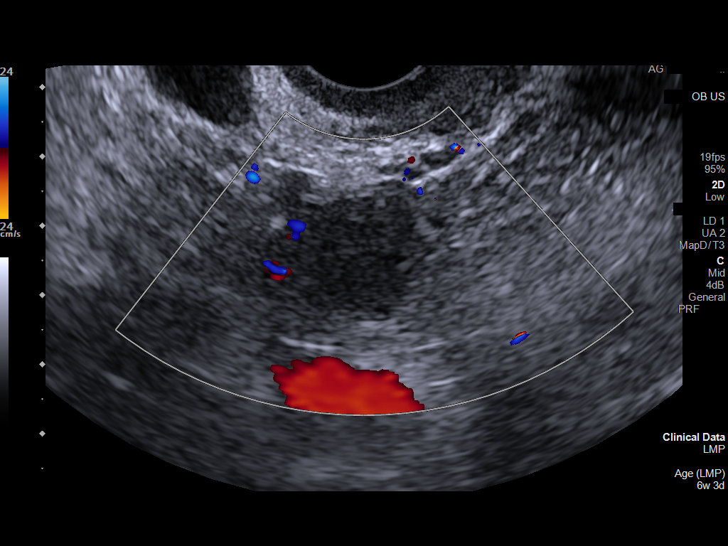
[im 90/98]
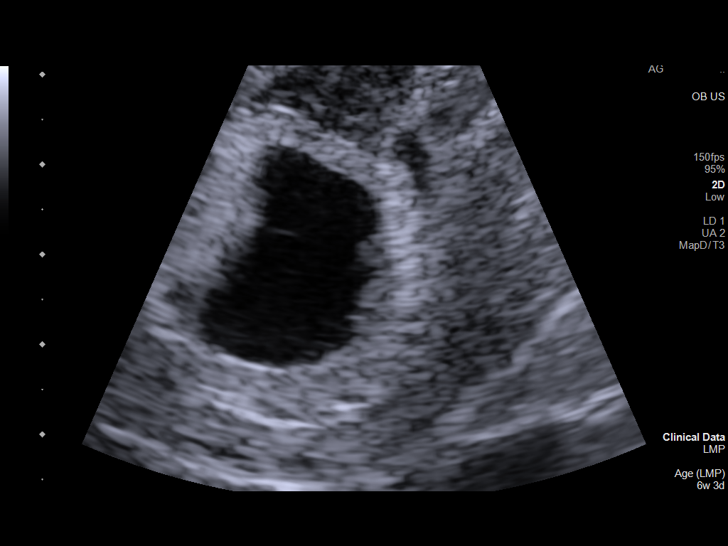
[im 98/98]
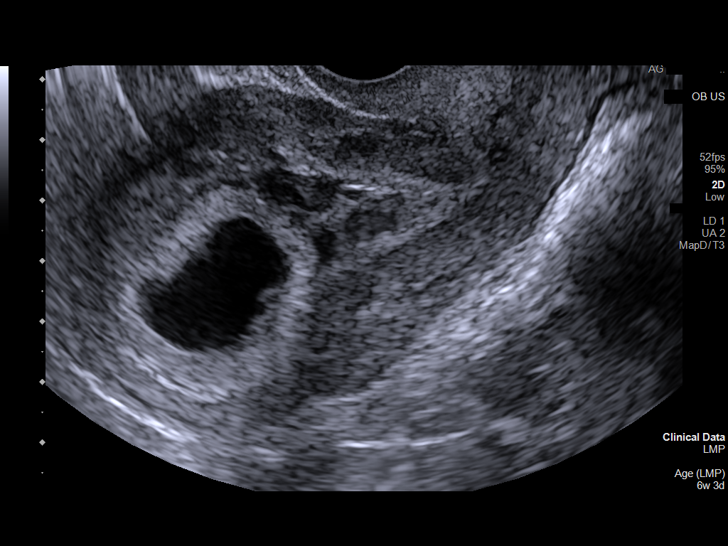

[14 of 28 positions shown; findings below may reference images not displayed]

FINDINGS: Intrauterine gestational sac: Single

Yolk sac:  Present

Embryo:  Present

Cardiac Activity: Present

Heart Rate: 126 bpm

CRL: 7.4 mm   6 w   4 d                  US EDC: 05/27/2021

Subchorionic hemorrhage: Moderate to large sized subchorionic
hemorrhage measuring 2.6 x 1.3 x 2.4 cm seen without associated mass
effect.

Maternal uterus/adnexae: Ovaries are normal in appearance
bilaterally. No adnexal mass or free fluid.
IMPRESSION: 1. Single viable intrauterine pregnancy as above, estimated
gestational age 6 weeks and 4 days by crown-rump length, with
ultrasound EDC of 05/27/2021.
2. Moderate to large sized subchorionic hemorrhage without
associated mass effect.
3. No other acute maternal uterine or adnexal abnormality.

## 2023-07-16 ENCOUNTER — Ambulatory Visit
Admission: RE | Admit: 2023-07-16 | Discharge: 2023-07-16 | Disposition: A | Discharge status: Home / Self Care | Payer: Medicaid Other | Source: Ambulatory Visit

## 2023-07-16 VITALS — BP 159/114 | HR 78 | Temp 98.2°F | Resp 18

## 2023-07-16 DIAGNOSIS — J3089 Other allergic rhinitis: Secondary | ICD-10-CM | POA: Diagnosis not present

## 2023-07-16 DIAGNOSIS — R051 Acute cough: Secondary | ICD-10-CM

## 2023-07-16 MED ORDER — FLUTICASONE PROPIONATE 50 MCG/ACT NA SUSP: 1.0000 | Freq: Two times a day (BID) | NASAL | 2 refills | Status: AC

## 2023-07-16 MED ORDER — PROMETHAZINE-DM 6.25-15 MG/5ML PO SYRP: 5.0000 mL | ORAL_SOLUTION | Freq: Every evening | ORAL | 0 refills | Status: AC | PRN

## 2023-07-16 MED ORDER — CETIRIZINE HCL 10 MG PO TABS: 10.0000 mg | ORAL_TABLET | Freq: Every day | ORAL | 2 refills | Status: AC

## 2023-07-16 NOTE — ED Provider Notes (Signed)
[facility]    CSN: [id number] Arrival date & time: 07/16/23  0935      History   Chief Complaint Chief Complaint  Patient presents with   Cough    More than likely my bronchitis is back. - Entered by patient    HPI [name] [name] is a 37 y.o. female.   Patient presenting today with 1 day history of cough, congestion.  Denies fever, chills, chest pain, shortness of breath, abdominal pain, nausea vomiting or diarrhea.  No known history of chronic pulmonary disease but does states that she is prone to bronchitis this time of year.  She is so far trying over-the-counter cough syrup with no relief.    Past Medical History:  Diagnosis Date   ASD (atrial septal defect)    Essential hypertension, benign    Mitral valve prolapse     Patient Active Problem List   Diagnosis Date Noted   Pediatric pre-birth visit for expectant parent 04/01/2021   TOBACCO ABUSE 10/15/2010   Essential hypertension, benign 07/03/2009   MITRAL VALVE PROLAPSE 07/03/2009   ATRIAL SEPTAL DEFECT 07/03/2009    Past Surgical History:  Procedure Laterality Date   ASD REPAIR  2002   [facility]    CARDIAC SURGERY     CHOLECYSTECTOMY  2011   [facility] - Dr. Lovell Sheehan    DILATION AND CURETTAGE OF UTERUS N/A 05/24/2020   Procedure: SUCTION DILATATION AND CURETTAGE;  Surgeon: [name], MD;  Location: [facility];  Service: Gynecology;  Laterality: N/A;    OB History     Gravida  2   Para  0   Term  0   Preterm  0   AB  0   Living  0      SAB  0   IAB  0   Ectopic  0   Multiple  0   Live Births  0            Home Medications    Prior to Admission medications   Medication Sig Start Date End Date Taking? Authorizing Provider  ALPRAZolam (XANAX) 0.5 MG tablet TAKE 1/2 TO 1 (ONE-HALF TO ONE) TABLET BY MOUTH ONCE DAILY AS NEEDED 06/11/21  Yes [provider]  cetirizine (ZYRTEC ALLERGY) 10 MG tablet Take 1 tablet (10 mg total) by mouth daily. 07/16/23  Yes  Particia Nearing, PA-C  citalopram (CELEXA) 20 MG tablet Take 1 tablet by mouth daily. 04/19/23  Yes [provider]  fluticasone (FLONASE) 50 MCG/ACT nasal spray Place 1 spray into both nostrils 2 (two) times daily. 07/16/23  Yes Particia Nearing, PA-C  promethazine-dextromethorphan (PROMETHAZINE-DM) 6.25-15 MG/5ML syrup Take 5 mLs by mouth at bedtime as needed. 07/16/23  Yes Particia Nearing, PA-C  acetaminophen (TYLENOL) 500 MG tablet Take 500-1,000 mg by mouth every 6 (six) hours as needed for headache.    [provider]  Prenatal Vit-Fe Fumarate-FA (PNV PRENATAL PLUS MULTIVITAMIN) 27-1 MG TABS Take 1 tablet by mouth daily.    [provider]  SLYND 4 MG TABS Take 1 tablet by mouth daily.    [provider]  progesterone (PROMETRIUM) 200 MG capsule Place vaginally at bedtime. 10/17/20 01/31/21  [provider]    Family History Family History  Problem Relation Age of Onset   Pneumonia Paternal Grandfather    Colon cancer Paternal Grandfather    Cancer Paternal Grandmother        skin   Blindness Paternal Grandmother  Other Maternal Grandmother        vertigo   Hypertension Maternal Grandmother    Rheum arthritis Maternal Grandmother    Anxiety disorder Maternal Grandmother    Colon cancer Maternal Grandfather    Cancer Father        rectal and colon   Post-traumatic stress disorder Father    Anxiety disorder Father    Stroke Mother    Anxiety disorder Mother    Cancer Sister        bladder   Migraines Maternal Aunt    Colon cancer Maternal Aunt    Cervical cancer Maternal Aunt     Social History Social History   Tobacco Use   Smoking status: Former    Current packs/day: 0.00    Average packs/day: 1 pack/day for 20.0 years (20.0 ttl pk-yrs)    Types: Cigarettes    Start date: 03/26/2000    Quit date: 03/26/2020    Years since quitting: 3.3   Smokeless tobacco: Never   Tobacco comments:    pt advised she did  eletric cig for 6 months until 05-15-13  Vaping Use   Vaping status: Never Used  Substance Use Topics   Alcohol use: Yes    Comment: occasionally   Drug use: No     Allergies   Amoxicillin, Other, and Penicillins   Review of Systems Review of Systems Per HPI  Physical Exam Triage Vital Signs ED Triage Vitals  Encounter Vitals Group     BP 07/16/23 1011 (!) 159/114     Systolic BP Percentile --      Diastolic BP Percentile --      Pulse Rate 07/16/23 1011 78     Resp 07/16/23 1011 18     Temp 07/16/23 1011 98.2 F (36.8 C)     Temp Source 07/16/23 1011 Oral     SpO2 07/16/23 1011 95 %     Weight --      Height --      Head Circumference --      Peak Flow --      Pain Score 07/16/23 1012 0     Pain Loc --      Pain Education --      Exclude from Growth Chart --    No data found.  Updated Vital Signs BP (!) 159/114 Comment: took cough meds  Pulse 78   Temp 98.2 F (36.8 C) (Oral)   Resp 18   LMP 06/30/2023 Comment: end date  SpO2 95%   Visual Acuity Right Eye Distance:   Left Eye Distance:   Bilateral Distance:    Right Eye Near:   Left Eye Near:    Bilateral Near:     Physical Exam Vitals and nursing note reviewed.  Constitutional:      Appearance: Normal appearance.  HENT:     Head: Atraumatic.     Right Ear: Tympanic membrane and external ear normal.     Left Ear: Tympanic membrane and external ear normal.     Nose: Rhinorrhea present.     Mouth/Throat:     Mouth: Mucous membranes are moist.     Pharynx: Posterior oropharyngeal erythema present.  Eyes:     Extraocular Movements: Extraocular movements intact.     Conjunctiva/sclera: Conjunctivae normal.  Cardiovascular:     Rate and Rhythm: Normal rate and regular rhythm.     Heart sounds: Normal heart sounds.  Pulmonary:     Effort: Pulmonary effort is normal.  Breath sounds: Normal breath sounds. No wheezing or rales.  Musculoskeletal:        General: Normal range of motion.      Cervical back: Normal range of motion and neck supple.  Skin:    General: Skin is warm and dry.  Neurological:     Mental Status: She is alert and oriented to person, place, and time.  Psychiatric:        Mood and Affect: Mood normal.        Thought Content: Thought content normal.      UC Treatments / Results  Labs (all labs ordered are listed, but only abnormal results are displayed) Labs Reviewed - No data to display  EKG   Radiology No results found.  Procedures Procedures (including critical care time)  Medications Ordered in UC Medications - No data to display  Initial Impression / Assessment and Plan / UC Course  I have reviewed the triage vital signs and the nursing notes.  Pertinent labs & imaging results that were available during my care of the patient were reviewed by me and considered in my medical decision making (see chart for details).     Suspect viral versus allergic etiology.  Hypertensive in triage otherwise vital signs within normal limits.  She is well-appearing and in no acute distress.  Treat with Phenergan DM and start good allergy regimen as this seems to be a seasonal issue for her.  Return for worsening symptoms.  Final Clinical Impressions(s) / UC Diagnoses   Final diagnoses:  Seasonal allergic rhinitis due to other allergic trigger  Acute cough     Discharge Instructions      In addition to the prescribed medications, you may take over-the-counter remedies such as Mucinex, DayQuil, and use saline sinus rinses.    ED Prescriptions     Medication Sig Dispense Auth. Provider   promethazine-dextromethorphan (PROMETHAZINE-DM) 6.25-15 MG/5ML syrup Take 5 mLs by mouth at bedtime as needed. 100 mL Particia Nearing, PA-C   cetirizine (ZYRTEC ALLERGY) 10 MG tablet Take 1 tablet (10 mg total) by mouth daily. 30 tablet Particia Nearing, PA-C   fluticasone [facility]) 50 MCG/ACT nasal spray Place 1 spray into both nostrils 2 (two)  times daily. 16 g Particia Nearing, [place]      PDMP not reviewed this encounter.   Particia Nearing, [place] 07/16/23 1443

## 2023-07-16 NOTE — Discharge Instructions (Signed)
In addition to the prescribed medications, you may take over-the-counter remedies such as Mucinex, DayQuil, and use saline sinus rinses.

## 2023-07-16 NOTE — ED Triage Notes (Signed)
Pt reports she has a cough and mucus x 1 day. Concerned that her bronchitis is back.

## 2024-05-16 ENCOUNTER — Encounter: Payer: Self-pay | Admitting: Women's Health

## 2024-05-16 ENCOUNTER — Other Ambulatory Visit (HOSPITAL_COMMUNITY)
Admission: RE | Admit: 2024-05-16 | Discharge: 2024-05-16 | Disposition: A | Discharge status: Home / Self Care | Source: Ambulatory Visit | Attending: Women's Health | Admitting: Women's Health

## 2024-05-16 ENCOUNTER — Ambulatory Visit (INDEPENDENT_AMBULATORY_CARE_PROVIDER_SITE_OTHER): Admitting: Women's Health

## 2024-05-16 VITALS — BP 117/88 | HR 92 | Ht 64.0 in | Wt 213.6 lb

## 2024-05-16 DIAGNOSIS — Z1151 Encounter for screening for human papillomavirus (HPV): Secondary | ICD-10-CM

## 2024-05-16 DIAGNOSIS — Z113 Encounter for screening for infections with a predominantly sexual mode of transmission: Secondary | ICD-10-CM

## 2024-05-16 DIAGNOSIS — Z1231 Encounter for screening mammogram for malignant neoplasm of breast: Secondary | ICD-10-CM

## 2024-05-16 DIAGNOSIS — Z01419 Encounter for gynecological examination (general) (routine) without abnormal findings: Secondary | ICD-10-CM

## 2024-05-16 DIAGNOSIS — Z86018 Personal history of other benign neoplasm: Secondary | ICD-10-CM | POA: Diagnosis not present

## 2024-05-16 DIAGNOSIS — N926 Irregular menstruation, unspecified: Secondary | ICD-10-CM

## 2024-05-16 NOTE — Addendum Note (Signed)
 Addended by: ILEAN RUTHERFORD HERO on: 05/16/2024 12:08 PM   Modules accepted: Orders

## 2024-05-16 NOTE — Progress Notes (Signed)
 WELL-WOMAN EXAMINATION Patient name: [name] [name] MRN [id number]  Date of birth: 11-19-1985 Chief Complaint:   Annual Exam (Bleeding since July 13 passing small clots- takes [facility])  History of Present Illness:   [name] [name] is a 38 y.o. [id number] Caucasian female being seen today for a routine well-woman exam.  Current complaints: started [facility] at end of June (has lost 20lbs!) and has been bleeding since July 13. Occ will have a day or so doesn't bleed. Can be light or heavy, dark red or light. Periods were regular prior to this. No pain. Did have pain w/ sex last week, but used a new condom brand. Denies abnormal discharge, itching/odor/irritation.  On Slynd (smokes). Wants a hysterectomy. Does have h/o fibroids.   PCP: [facility]      does not desire labs No LMP recorded. (Menstrual status: Oral contraceptives). The current method of family planning is oral progesterone-only contraceptive.  Last pap ~63yrs ago. Results were: neg per pt at [facility]. H/O abnormal pap: no Last mammogram: never. Results were: N/A. Family h/o breast cancer: no Last colonoscopy: never. Results were: N/A. Family h/o colorectal cancer: yes father late 40s/early 46s (still living) and PA (deceased), unsure if they had genetic testing.     05/16/2024    9:55 AM 11/02/2017    3:28 PM 11/02/2017    3:27 PM  Depression screen PHQ 2/9  Decreased Interest 1 1 1   Down, Depressed, Hopeless 0 1 1  PHQ - 2 Score 1 2 2   Altered sleeping 1 2   Tired, decreased energy 1 3   Change in appetite 1 0   Feeling bad or failure about yourself  0 3   Trouble concentrating 0 0   Moving slowly or fidgety/restless 0 0   Suicidal thoughts 0 0   PHQ-9 Score 4 10   Difficult doing work/chores  Not difficult at all         05/16/2024    9:56 AM  GAD 7 : Generalized Anxiety Score  Nervous, Anxious, on Edge 1  Control/stop worrying 2  Worry too much - different things 2  Trouble relaxing 1  Restless 2  Easily annoyed or  irritable 0  Afraid - awful might happen 0  Total GAD 7 Score 8     Review of Systems:   Pertinent items are noted in HPI Denies any headaches, blurred vision, fatigue, shortness of breath, chest pain, abdominal pain, abnormal vaginal discharge/itching/odor/irritation, problems with periods, bowel movements, urination, or intercourse unless otherwise stated above. Pertinent History Reviewed:  Reviewed past medical,surgical, social and family history.  Reviewed problem list, medications and allergies. Physical Assessment:   Vitals:   05/16/24 0949  BP: 117/88  Pulse: 92  Weight: 213 lb 9.6 oz (96.9 kg)  Height: 5' 4 (1.626 m)  Body mass index is 36.66 kg/m.        Physical Examination:   General appearance - well appearing, and in no distress  Mental status - alert, oriented to person, place, and time  Psych:  She has a normal mood and affect  Skin - warm and dry, normal color, no suspicious lesions noted  Chest - effort normal, all lung fields clear to auscultation bilaterally  Heart - normal rate and regular rhythm  Neck:  midline trachea, no thyromegaly or nodules  Breasts - breasts appear normal, no suspicious masses, no skin or nipple changes or  axillary nodes  Abdomen - soft, nontender, nondistended, no masses or organomegaly  Pelvic -  VULVA: normal appearing vulva with no masses, tenderness or lesions  VAGINA: normal appearing vagina with normal color and discharge, no lesions, light menstrual blood  CERVIX: normal appearing cervix without discharge or lesions, no CMT  Thin prep pap is done w/ HR HPV cotesting  UTERUS: uterus is felt to be normal size, shape, consistency and nontender   ADNEXA: No adnexal masses or tenderness noted.  Extremities:  No swelling or varicosities noted  Chaperone: Latisha Cresenzo  No results found for this or any previous visit (from the past 24 hours).  Assessment & Plan:  1) Well-Woman Exam  2) Prolonged period>started righ after  beginning Wegovy, currently on Slynd (smokes). Has known fibroids, wants hysterectomy. CV swab sent, will get pelvic u/s and f/u w/ Dr. Ozan (prefers female). Note sent to [facility] to schedule  3) Family hx colon cancer> dad (late 40s/early 66s) and PA (deceased), discussed hereditary cancer screening, unsure if wants, will talk to dad to see if he had and let us  know if she decides to get  Labs/procedures today: pap, CV swab  Mammogram: turns 38yo tomorrow, will order (wants to get mammo and pelvic u/s at same time/place- prefers [place]) Colonoscopy: @ 38yo, or sooner if problems  Orders Placed This Encounter  Procedures   US  PELVIC COMPLETE WITH TRANSVAGINAL   MM 3D SCREENING MAMMOGRAM BILATERAL BREAST    Meds: No orders of the defined types were placed in this encounter.   Follow-up: Return for ordering pelvic u/s in [place] (GI if possible), then f/u w/ Dr. Ozan after u/s.  Suzen JONELLE Fetters CNM, [facility] 05/16/2024 10:29 AM

## 2024-05-17 ENCOUNTER — Ambulatory Visit: Payer: Self-pay | Admitting: Women's Health

## 2024-05-17 LAB — CERVICOVAGINAL ANCILLARY ONLY
Bacterial Vaginitis (gardnerella): NEGATIVE
Candida Glabrata: NEGATIVE
Candida Vaginitis: NEGATIVE
Chlamydia: NEGATIVE
Comment: NEGATIVE
Comment: NEGATIVE
Comment: NEGATIVE
Comment: NEGATIVE
Comment: NEGATIVE
Comment: NORMAL
Neisseria Gonorrhea: NEGATIVE
Trichomonas: NEGATIVE

## 2024-05-18 LAB — CYTOLOGY - PAP
Comment: NEGATIVE
Diagnosis: NEGATIVE
High risk HPV: NEGATIVE

## 2024-05-23 ENCOUNTER — Ambulatory Visit
Admission: RE | Admit: 2024-05-23 | Discharge: 2024-05-23 | Disposition: A | Discharge status: Home / Self Care | Source: Ambulatory Visit | Attending: Women's Health | Admitting: Women's Health

## 2024-05-23 DIAGNOSIS — N926 Irregular menstruation, unspecified: Secondary | ICD-10-CM

## 2024-05-23 DIAGNOSIS — Z86018 Personal history of other benign neoplasm: Secondary | ICD-10-CM

## 2024-06-06 ENCOUNTER — Ambulatory Visit

## 2024-06-07 ENCOUNTER — Ambulatory Visit: Admitting: Obstetrics & Gynecology

## 2024-06-19 ENCOUNTER — Encounter: Payer: Self-pay | Admitting: Obstetrics & Gynecology

## 2024-06-19 ENCOUNTER — Ambulatory Visit (INDEPENDENT_AMBULATORY_CARE_PROVIDER_SITE_OTHER): Admitting: Obstetrics & Gynecology

## 2024-06-19 VITALS — BP 113/80 | HR 87 | Ht 64.0 in | Wt 207.0 lb

## 2024-06-19 DIAGNOSIS — N939 Abnormal uterine and vaginal bleeding, unspecified: Secondary | ICD-10-CM | POA: Diagnosis not present

## 2024-06-19 DIAGNOSIS — Z720 Tobacco use: Secondary | ICD-10-CM

## 2024-06-19 DIAGNOSIS — D250 Submucous leiomyoma of uterus: Secondary | ICD-10-CM | POA: Diagnosis not present

## 2024-06-19 MED ORDER — SLYND 4 MG PO TABS
1.0000 | ORAL_TABLET | Freq: Every day | ORAL | 4 refills | Status: AC
Start: 2024-06-19 — End: 2024-09-17

## 2024-06-19 NOTE — Progress Notes (Signed)
 GYN VISIT Patient name: [name] [name] MRN [id number]  Date of birth: 1986-01-12 Chief Complaint:   Follow-up (us )  History of Present Illness:   [name] [name] is a 38 y.o. [phone]  female being seen today for the following concerns:  AUB: While she notes a history of always having heavy menstrual bleeding, she notes that over the past several months she has noted a change in her menses.  She has been on Slynd for a few years now however it seems that the bleeding changes correlated with weight loss and Wegovy use.   Started in July has had daily bleeding- heaviness varies. May have a 1-2 weeks break before the bleeding will return.  Of note the bleeding is most notable when she wipes she will see some bright red blood or even a clot on the toilet paper.  She does not typically have bleeding on her pad  Pelvic ultrasound completed: 05/2024: 9.4 x 4 x 5.5 cm uterus.  106 cc.  1.8 cm transmural fibroid abutting the submucosal space.  No other abnormalities noted.  Normal ovaries bilaterally.  She denies significant dysmenorrhea.  She has noted decreased libido and fatigue, but jokes that she is raising a 21-year-old  Patient's last menstrual period was 06/19/2024.    Review of Systems:   Pertinent items are noted in HPI Denies fever/chills, dizziness, headaches, visual disturbances, fatigue, shortness of breath, chest pain, abdominal pain, vomiting Pertinent History Reviewed:   Past Surgical History:  Procedure Laterality Date   ASD REPAIR  2002   [facility]    CARDIAC SURGERY     CHOLECYSTECTOMY  2011   [facility] - Dr. Mavis    DILATION AND CURETTAGE OF UTERUS N/A 05/24/2020   Procedure: SUCTION DILATATION AND CURETTAGE;  Surgeon: Edsel Norleen GAILS, MD;  Location: [facility];  Service: Gynecology;  Laterality: N/A;    Past Medical History:  Diagnosis Date   Anxiety    ASD (atrial septal defect)    Essential hypertension, benign    Mitral valve prolapse    Reviewed problem  list, medications and allergies. Physical Assessment:   Vitals:   06/19/24 1425  BP: 113/80  Pulse: 87  Weight: 207 lb (93.9 kg)  Height: 5' 4 (1.626 m)  Body mass index is 35.53 kg/m.       Physical Examination:   General appearance: alert, well appearing, and in no distress  Psych: mood appropriate, normal affect  Skin: warm & dry   Cardiovascular: normal heart rate noted  Respiratory: normal respiratory effort, no distress  Extremities: no edema   Chaperone: N/A    Assessment & Plan:  1) AUB -Reviewed pelvic ultrasound that overall only showed a small fibroid, no other abnormalities noted - Long discussion with patient's regarding her options - Due to tobacco use estrogen contraindicated - Discussed trial of alternative progesterone, TXA, IUD, Depo - Discussed surgical intervention including endometrial ablation or hysterectomy - Reviewed endometrial ablation as same-day procedure with less risk and shorter recovery - Discussed robotic assisted laparoscopic hysterectomy, bilateral salpingectomy.  Reviewed ovarian preservation.  Discussed recovery of 8 to 12 weeks.  Discussed risks including but not limited to risk of bleeding, infection, injury.  Discussed potential long-term complications including things such as vaginal cuff dehiscence, fistula or other surgical complication that cannot be predicted - Patient is not sure about how she wishes to proceed - She also shared that she is soon to run out of [facility] and is curious to see  how this may also change her menses - After much back-and-forth the plan will be to continue with Slynd as while the bleeding is irregular, it does not seem heavy  Majority of today's visit was spent in consultation regarding the above information  Meds ordered this encounter  Medications   SLYND 4 MG TABS    Sig: Take 1 tablet (4 mg total) by mouth daily.    Dispense:  90 tablet    Refill:  4      No orders of the defined types were  placed in this encounter.   Return in about 3 months (around 09/19/2024) for Medication follow up.   Ronnald Shedden, DO Attending Obstetrician & Gynecologist, [facility] [employer], [facility]

## 2024-06-22 ENCOUNTER — Ambulatory Visit
Admission: RE | Admit: 2024-06-22 | Discharge: 2024-06-22 | Disposition: A | Discharge status: Home / Self Care | Source: Ambulatory Visit | Attending: Women's Health | Admitting: Women's Health

## 2024-06-22 DIAGNOSIS — Z1231 Encounter for screening mammogram for malignant neoplasm of breast: Secondary | ICD-10-CM
